# Patient Record
Sex: Female | Born: 1985 | Hispanic: Refuse to answer | Marital: Married | Smoking: Never smoker
Health system: Southern US, Community
[De-identification: ages and names within clinical notes are randomized; demographics above are authoritative.]

## PROBLEM LIST (undated history)

## (undated) DIAGNOSIS — I2699 Other pulmonary embolism without acute cor pulmonale: Secondary | ICD-10-CM

## (undated) DIAGNOSIS — R21 Rash and other nonspecific skin eruption: Secondary | ICD-10-CM

## (undated) DIAGNOSIS — D72819 Decreased white blood cell count, unspecified: Principal | ICD-10-CM

## (undated) DIAGNOSIS — E282 Polycystic ovarian syndrome: Secondary | ICD-10-CM

## (undated) DIAGNOSIS — G939 Disorder of brain, unspecified: Secondary | ICD-10-CM

## (undated) DIAGNOSIS — T148XXA Other injury of unspecified body region, initial encounter: Secondary | ICD-10-CM

## (undated) HISTORY — DX: Disorder of brain, unspecified: G93.9

## (undated) HISTORY — DX: Rash and other nonspecific skin eruption: R21

## (undated) HISTORY — DX: Polycystic ovarian syndrome: E28.2

## (undated) HISTORY — DX: Decreased white blood cell count, unspecified: D72.819

## (undated) HISTORY — DX: Other injury of unspecified body region, initial encounter: T14.8XXA

---

## 2009-03-18 DIAGNOSIS — G939 Disorder of brain, unspecified: Secondary | ICD-10-CM

## 2009-03-18 HISTORY — DX: Disorder of brain, unspecified: G93.9

## 2013-01-01 ENCOUNTER — Telehealth: Payer: Self-pay | Admitting: Hematology and Oncology

## 2013-01-01 NOTE — Telephone Encounter (Signed)
C/D 01/01/13 for appt. 10/29

## 2013-01-01 NOTE — Telephone Encounter (Signed)
S/W PT AND GVE NP APPT 10/29 @ 9:30 W/DR. GORSUCH RERFERRING - CAROL CURTIS, NP DX-PERSISTENT LOW WBC W/COMPLAINTS OF FATIGUE WELCOME PACKET EMAIL.

## 2013-01-13 ENCOUNTER — Other Ambulatory Visit (HOSPITAL_COMMUNITY)
Admission: RE | Admit: 2013-01-13 | Discharge: 2013-01-13 | Disposition: A | Discharge status: Home / Self Care | Payer: 59 | Source: Ambulatory Visit | Attending: Hematology and Oncology | Admitting: Hematology and Oncology

## 2013-01-13 ENCOUNTER — Encounter: Payer: Self-pay | Admitting: Hematology and Oncology

## 2013-01-13 ENCOUNTER — Ambulatory Visit (HOSPITAL_BASED_OUTPATIENT_CLINIC_OR_DEPARTMENT_OTHER): Payer: 59 | Admitting: Hematology and Oncology

## 2013-01-13 ENCOUNTER — Telehealth: Payer: Self-pay | Admitting: Hematology and Oncology

## 2013-01-13 ENCOUNTER — Ambulatory Visit (HOSPITAL_BASED_OUTPATIENT_CLINIC_OR_DEPARTMENT_OTHER): Payer: 59 | Admitting: Lab

## 2013-01-13 ENCOUNTER — Encounter (INDEPENDENT_AMBULATORY_CARE_PROVIDER_SITE_OTHER): Payer: Self-pay

## 2013-01-13 ENCOUNTER — Ambulatory Visit (HOSPITAL_BASED_OUTPATIENT_CLINIC_OR_DEPARTMENT_OTHER): Payer: 59

## 2013-01-13 VITALS — BP 106/64 | HR 58 | Temp 97.5°F | Resp 20 | Ht 69.5 in | Wt 175.1 lb

## 2013-01-13 DIAGNOSIS — D509 Iron deficiency anemia, unspecified: Secondary | ICD-10-CM | POA: Insufficient documentation

## 2013-01-13 DIAGNOSIS — D72819 Decreased white blood cell count, unspecified: Secondary | ICD-10-CM

## 2013-01-13 DIAGNOSIS — R5382 Chronic fatigue, unspecified: Secondary | ICD-10-CM

## 2013-01-13 DIAGNOSIS — R21 Rash and other nonspecific skin eruption: Secondary | ICD-10-CM

## 2013-01-13 HISTORY — DX: Decreased white blood cell count, unspecified: D72.819

## 2013-01-13 HISTORY — DX: Rash and other nonspecific skin eruption: R21

## 2013-01-13 LAB — CBC WITH DIFFERENTIAL/PLATELET
BASO%: 2.2 % — ABNORMAL HIGH (ref 0.0–2.0)
Basophils Absolute: 0.1 10e3/uL (ref 0.0–0.1)
EOS%: 2.5 % (ref 0.0–7.0)
Eosinophils Absolute: 0.1 10e3/uL (ref 0.0–0.5)
HCT: 36.3 % (ref 34.8–46.6)
HGB: 12.3 g/dL (ref 11.6–15.9)
LYMPH%: 45.2 % (ref 14.0–49.7)
MCH: 28.8 pg (ref 25.1–34.0)
MCHC: 33.9 g/dL (ref 31.5–36.0)
MCV: 85 fL (ref 79.5–101.0)
MONO#: 0.4 10e3/uL (ref 0.1–0.9)
MONO%: 12.9 % (ref 0.0–14.0)
NEUT#: 1 10e3/uL — ABNORMAL LOW (ref 1.5–6.5)
NEUT%: 37.2 % — ABNORMAL LOW (ref 38.4–76.8)
Platelets: 182 10e3/uL (ref 145–400)
RBC: 4.27 10e6/uL (ref 3.70–5.45)
RDW: 12.1 % (ref 11.2–14.5)
WBC: 2.8 10e3/uL — ABNORMAL LOW (ref 3.9–10.3)
lymph#: 1.3 10e3/uL (ref 0.9–3.3)

## 2013-01-13 LAB — COMPREHENSIVE METABOLIC PANEL (CC13)
Albumin: 4 g/dL (ref 3.5–5.0)
Anion Gap: 7 mEq/L (ref 3–11)
BUN: 6.9 mg/dL — ABNORMAL LOW (ref 7.0–26.0)
CO2: 23 mEq/L (ref 22–29)
Calcium: 9.8 mg/dL (ref 8.4–10.4)
Chloride: 109 mEq/L (ref 98–109)
Glucose: 87 mg/dl (ref 70–140)
Potassium: 3.9 mEq/L (ref 3.5–5.1)

## 2013-01-13 LAB — FERRITIN CHCC: Ferritin: 35 ng/ml (ref 9–269)

## 2013-01-13 LAB — MORPHOLOGY

## 2013-01-13 LAB — LACTATE DEHYDROGENASE (CC13): LDH: 151 U/L (ref 125–245)

## 2013-01-13 NOTE — Progress Notes (Signed)
[name], [name], [name] 27 y.o. female is here because of abnormal CBC. Over the past few months, she has occasional swelling of lymph nodes under her armpit and groin that comes and goes. The lymph nodes are tender when they are swollen. Most recently, she also noticed some mild rash on her skin around the ankles that are non-itching. She denies rash elsewhere. Most importantly, she had persistent, chronic fatigue. She also has achiness in her muscles and bones from time to time. The patient had history of heavy menstruation and abdominal cramps. She has never been diagnosed with anemia. She also has chronic headaches that comes and goes. She saw a neurologist 3 years ago who order a scan of her head and show some abnormal spots but she was lost to followup. Denies any focal neurological deficit. She denies any recent infection. No recent antibiotic therapy. No history of atypical infection such as shingles or meningitis. The patient has no risk factors for HIV infection. She's never been transfused before and never used intravenous drugs. MEDICAL HISTORY:  Past Medical History  Diagnosis Date  . Lesion of brain 2011  . PCOS (polycystic ovarian syndrome)   . Leukopenia 01/13/2013  . Rash, skin 01/13/2013    SURGICAL HISTORY: History reviewed. No pertinent past surgical history.  SOCIAL HISTORY: History   Social History  . Marital Status: Single    Spouse Name: N/A    Number of Children: N/A  . Years of Education: N/A   Occupational History  . Not on file.   Social History Main Topics  . Smoking status: Never Smoker   . Smokeless tobacco: Not on file  . Alcohol Use: Not on file  . Drug Use: No  . Sexual Activity: Not on file   Other Topics Concern  . Not on file    Social History Narrative  . No narrative on file    FAMILY HISTORY: Family History  Problem Relation Age of Onset  . Graves' disease Mother     ALLERGIES:  has No Known Allergies.  MEDICATIONS:  Current Outpatient Prescriptions  Medication Sig Dispense Refill  . acetaminophen (TYLENOL) 325 MG tablet Take 650 mg by mouth every 6 (six) hours as needed for pain.      Marland Kitchen BIOTIN PO Take by mouth daily.      . Loratadine 10 MG CAPS Take by mouth daily as needed.      . Multiple Vitamins-Minerals (MULTIVITAMIN GUMMIES ADULTS PO) Take by mouth daily.       No current facility-administered medications for this visit.    REVIEW OF SYSTEMS:   Constitutional: Denies fevers, chills or abnormal night sweats Eyes: Denies blurriness of vision, double vision or watery eyes Ears, nose, mouth, throat, and face: Denies mucositis or sore throat Respiratory: Denies cough, dyspnea or wheezes Cardiovascular: Denies palpitation, chest discomfort or lower extremity swelling Gastrointestinal:  Denies nausea, heartburn or change in bowel habits Lymphatics: Denies new lymphadenopathy or easy bruising Neurological:Denies numbness, tingling or new weaknesses Behavioral/Psych: Mood is stable, no new changes  All other systems were reviewed with the patient and are negative.  PHYSICAL EXAMINATION: ECOG PERFORMANCE STATUS: 0 - Asymptomatic  Filed Vitals:   01/13/13 0950  BP: 106/64  Pulse: 58  Temp: 97.5 F (36.4 C)  Resp: 20   Filed Weights  01/13/13 0950  Weight: 175 lb 1.6 oz (79.425 kg)    GENERAL:alert, no distress and comfortable SKIN: Noted small macular/papular rash around her ankles  EYES: normal, conjunctiva are pink and non-injected, sclera clear OROPHARYNX:no exudate, no erythema and lips, buccal mucosa, and tongue normal  NECK: supple, thyroid normal size, non-tender, without nodularity LYMPH:  no palpable lymphadenopathy in the cervical, axillary or inguinal LUNGS: clear to  auscultation and percussion with normal breathing effort HEART: regular rate & rhythm and no murmurs and no lower extremity edema ABDOMEN:abdomen soft, non-tender and normal bowel sounds. No splenomegaly Musculoskeletal:no cyanosis of digits and no clubbing  PSYCH: alert & oriented x 3 with fluent speech NEURO: no focal motor/sensory deficits  LABORATORY DATA:  I have reviewed the data as listed. On 11/26/2012, thyroid function test is normal. CBC showed a white count of 3, hemoglobin 13.6, platelet count 246. On 12/28/2012, white count 3.8, hemoglobin 12.8, and platelet count of 253.  ASSESSMENT:  #1 chronic leukopenia #2 chronic fatigue #3 mild rash around the ankles, unknown etiology #4 history of lymphadenopathy  PLAN:  I recommend bloodwork evaluation to determine the cause of her leukopenia. She has family history of systemic lupus and given her history of skin rash and fatigue and leukopenia, certainly could be one of the differential diagnosis. I will order bloodwork to rule out autoimmune disease as well as rule out infection. Not uncommonly, African American population and a high incidence of congenital leukopenia. The patient is asymptomatic. She is currently not neutropenic. There is no indication to prescribe antibiotics. She has lymphadenopathy of unknown etiology but there were no palpable lymph nodes today. I do not know what caused the rash around her ankles. If her blood work is not revealing, she might need a dermatologist evaluation for possible skin biopsy to rule out vasculitis in the future. I will see her back in 2 weeks to review test results. Orders Placed This Encounter  Procedures  . CBC with Differential    Standing Status: Future     Number of Occurrences:      Standing Expiration Date: 10/05/2013  . Comprehensive metabolic panel    Standing Status: Future     Number of Occurrences:      Standing Expiration Date: 01/13/2014  . Lactate dehydrogenase     Standing Status: Future     Number of Occurrences:      Standing Expiration Date: 01/13/2014  . Morphology    Standing Status: Future     Number of Occurrences:      Standing Expiration Date: 01/13/2014  . Flow Cytometry    R/o leukemia, chronic leukopenia    Standing Status: Future     Number of Occurrences:      Standing Expiration Date: 01/13/2014  . Sedimentation rate    Standing Status: Future     Number of Occurrences:      Standing Expiration Date: 01/13/2014  . Smear    Standing Status: Future     Number of Occurrences:      Standing Expiration Date: 01/13/2014  . Vitamin B12    Standing Status: Future     Number of Occurrences:      Standing Expiration Date: 01/13/2014  . Folate RBC    Standing Status: Future     Number of Occurrences:      Standing Expiration Date: 01/13/2014  . ANA w/Reflex if Positive    Standing Status: Future     Number of Occurrences:  Standing Expiration Date: 01/13/2014  . Rheumatoid factor    Standing Status: Future     Number of Occurrences:      Standing Expiration Date: 01/13/2014  . HIV antibody    Standing Status: Future     Number of Occurrences:      Standing Expiration Date: 01/13/2014  . Hepatitis B surface antibody    Standing Status: Future     Number of Occurrences:      Standing Expiration Date: 01/13/2014  . Hepatitis B core antibody, IgM    Standing Status: Future     Number of Occurrences:      Standing Expiration Date: 01/13/2014  . Hepatitis C antibody    Standing Status: Future     Number of Occurrences:      Standing Expiration Date: 01/13/2014  . C-reactive protein    Standing Status: Future     Number of Occurrences:      Standing Expiration Date: 01/13/2014  . Ferritin    Standing Status: Future     Number of Occurrences:      Standing Expiration Date: 01/13/2014    All questions were answered. The patient knows to call the clinic with any problems, questions or concerns.    Phoebie Shad,  MD 01/13/2013 10:28 AM

## 2013-01-13 NOTE — Progress Notes (Signed)
Checked in new patient with no financial issues. She has not been out of the country and has appt card.

## 2013-01-13 NOTE — Telephone Encounter (Signed)
gv and printed appt sched and avs for pt for NOV...sent pt to lab °

## 2013-01-14 LAB — SEDIMENTATION RATE: Sed Rate: 4 mm/hr (ref 0–22)

## 2013-01-14 LAB — HEPATITIS B SURFACE ANTIBODY,QUALITATIVE: Hep B S Ab: POSITIVE — AB

## 2013-01-14 LAB — RHEUMATOID FACTOR: Rhuematoid fact SerPl-aCnc: <10 IU/mL (ref <=14)

## 2013-01-14 LAB — HEPATITIS B CORE ANTIBODY, IGM: Hep B C IgM: NONREACTIVE

## 2013-01-14 LAB — FOLATE RBC: RBC Folate: 715 ng/mL (ref >=366)

## 2013-01-14 LAB — HIV ANTIBODY (ROUTINE TESTING W REFLEX): HIV: NONREACTIVE

## 2013-01-27 ENCOUNTER — Telehealth: Payer: Self-pay | Admitting: Hematology and Oncology

## 2013-01-27 ENCOUNTER — Ambulatory Visit (HOSPITAL_BASED_OUTPATIENT_CLINIC_OR_DEPARTMENT_OTHER): Payer: 59 | Admitting: Hematology and Oncology

## 2013-01-27 ENCOUNTER — Ambulatory Visit (HOSPITAL_BASED_OUTPATIENT_CLINIC_OR_DEPARTMENT_OTHER): Payer: 59

## 2013-01-27 VITALS — BP 121/69 | HR 93 | Temp 97.8°F | Resp 18 | Ht 69.5 in | Wt 177.3 lb

## 2013-01-27 DIAGNOSIS — R5381 Other malaise: Secondary | ICD-10-CM

## 2013-01-27 DIAGNOSIS — R5383 Other fatigue: Secondary | ICD-10-CM | POA: Insufficient documentation

## 2013-01-27 DIAGNOSIS — D72819 Decreased white blood cell count, unspecified: Secondary | ICD-10-CM

## 2013-01-27 DIAGNOSIS — R21 Rash and other nonspecific skin eruption: Secondary | ICD-10-CM

## 2013-01-27 DIAGNOSIS — D509 Iron deficiency anemia, unspecified: Secondary | ICD-10-CM

## 2013-01-27 LAB — TSH CHCC: TSH: 1.049 m(IU)/L (ref 0.308–3.960)

## 2013-01-27 LAB — CBC WITH DIFFERENTIAL/PLATELET
Basophils Absolute: 0.1 10*3/uL (ref 0.0–0.1)
EOS%: 2.3 % (ref 0.0–7.0)
Eosinophils Absolute: 0.1 10*3/uL (ref 0.0–0.5)
LYMPH%: 29.3 % (ref 14.0–49.7)
MCH: 29 pg (ref 25.1–34.0)
MCV: 88.1 fL (ref 79.5–101.0)
MONO%: 10.2 % (ref 0.0–14.0)
NEUT#: 2.9 10*3/uL (ref 1.5–6.5)
Platelets: 221 10*3/uL (ref 145–400)
RBC: 4.31 10*6/uL (ref 3.70–5.45)
RDW: 12.9 % (ref 11.2–14.5)
lymph#: 1.5 10*3/uL (ref 0.9–3.3)

## 2013-01-27 NOTE — Progress Notes (Signed)
[facility] OFFICE PROGRESS NOTE  MCNEILL,WENDY, MD DIAGNOSIS:  Leukopenia  SUMMARY OF HEMATOLOGIC HISTORY: This patient was referred here because of leukopenia. She also has occasional swelling underneath her armpit that comes and goes. The patient also had fatigue. INTERVAL HISTORY: Pamela Oconnor 27 y.o. female returns for followup. She is still not feeling well. She denies any recent fever, chills, night sweats or abnormal weight loss The skin rash around her ankles are persistent I have reviewed the past medical history, past surgical history, social history and family history with the patient and they are unchanged from previous note.  ALLERGIES:  has No Known Allergies.  MEDICATIONS:  Current Outpatient Prescriptions  Medication Sig Dispense Refill  . acetaminophen (TYLENOL) 325 MG tablet Take 650 mg by mouth every 6 (six) hours as needed for pain.      Marland Kitchen BIOTIN PO Take by mouth daily.      . Loratadine 10 MG CAPS Take by mouth daily as needed.      . Multiple Vitamins-Minerals (MULTIVITAMIN GUMMIES ADULTS PO) Take by mouth daily.       No current facility-administered medications for this visit.     REVIEW OF SYSTEMS:   Constitutional: Denies fevers, chills or night sweats Eyes: Denies blurriness of vision Ears, nose, mouth, throat, and face: Denies mucositis or sore throat Respiratory: Denies cough, dyspnea or wheezes Cardiovascular: Denies palpitation, chest discomfort or lower extremity swelling Gastrointestinal:  Denies nausea, heartburn or change in bowel habits Skin: Denies abnormal skin rashes Lymphatics: Denies new lymphadenopathy or easy bruising Neurological:Denies numbness, tingling or new weaknesses Behavioral/Psych: Mood is stable, no new changes  All other systems were reviewed with the patient and are negative.  PHYSICAL EXAMINATION: ECOG PERFORMANCE STATUS: 1 - Symptomatic but completely ambulatory  Filed Vitals:   01/27/13 1218  BP:  121/69  Pulse: 93  Temp: 97.8 F (36.6 C)  Resp: 18   Filed Weights   01/27/13 1218  Weight: 177 lb 4.8 oz (80.423 kg)    GENERAL:alert, no distress and comfortable SKIN: skin color, texture, turgor are normal, with persistent macular rash around her ankles NEURO: alert & oriented x 3 with fluent speech, no focal motor/sensory deficits  LABORATORY DATA:  I have reviewed the data as listed I also reviewed her peripheral smear. There was absolute leukopenia on the peripheral dated 01/13/2013. The morphology of the white blood cells and red blood cells were normal  ASSESSMENT:  #1 leukopenia #2 mild iron deficiency #3 abnormal rash #4 fatigue  PLAN:  #1 leukopenia I rechecked his CBC again today. The results came back within normal limits. It is likely she may have a transient viral infection but it has resolved. No further workup is needed. At present time, there is no indication that she have lymphoma or leukemia. I would not recommend further testing #2 mild iron deficiency Even though she is not anemic, her iron is borderline low. I recommend she continue on multivitamin #3 abnormal rash I recommend referral to dermatologist.  #4 fatigue I am checking her thyroid function tests today. Her mother had a history of Graves' disease. I will call the patient back with test results  All questions were answered. The patient knows to call the clinic with any problems, questions or concerns. No barriers to learning was detected.    [facility], Kenedee Molesky, MD 01/27/2013 1:38 PM

## 2013-01-27 NOTE — Telephone Encounter (Signed)
gv and printed appt sched and avs for pt for NOV...gv pt barium  °

## 2013-01-27 NOTE — Telephone Encounter (Signed)
gv and printed appt sched and avs forpt for Jan 6 @ 8:20 with Dr. Sharyn Lull

## 2013-01-28 LAB — HCG, SERUM, QUALITATIVE: Preg, Serum: NEGATIVE

## 2013-01-28 LAB — T4, FREE: Free T4: 1.09 ng/dL (ref 0.80–1.80)

## 2013-02-03 ENCOUNTER — Ambulatory Visit: Payer: 59 | Admitting: Hematology and Oncology

## 2013-05-17 ENCOUNTER — Encounter (HOSPITAL_BASED_OUTPATIENT_CLINIC_OR_DEPARTMENT_OTHER): Payer: Self-pay | Admitting: Emergency Medicine

## 2013-05-17 DIAGNOSIS — Z8669 Personal history of other diseases of the nervous system and sense organs: Secondary | ICD-10-CM | POA: Insufficient documentation

## 2013-05-17 DIAGNOSIS — Z3202 Encounter for pregnancy test, result negative: Secondary | ICD-10-CM | POA: Insufficient documentation

## 2013-05-17 DIAGNOSIS — Z8639 Personal history of other endocrine, nutritional and metabolic disease: Secondary | ICD-10-CM | POA: Insufficient documentation

## 2013-05-17 DIAGNOSIS — M25519 Pain in unspecified shoulder: Secondary | ICD-10-CM | POA: Insufficient documentation

## 2013-05-17 DIAGNOSIS — R0789 Other chest pain: Secondary | ICD-10-CM | POA: Insufficient documentation

## 2013-05-17 DIAGNOSIS — Z862 Personal history of diseases of the blood and blood-forming organs and certain disorders involving the immune mechanism: Secondary | ICD-10-CM | POA: Insufficient documentation

## 2013-05-17 NOTE — ED Notes (Signed)
Chest and back pain

## 2013-05-18 ENCOUNTER — Emergency Department (HOSPITAL_BASED_OUTPATIENT_CLINIC_OR_DEPARTMENT_OTHER): Payer: 59

## 2013-05-18 ENCOUNTER — Emergency Department (HOSPITAL_BASED_OUTPATIENT_CLINIC_OR_DEPARTMENT_OTHER)
Admission: EM | Admit: 2013-05-18 | Discharge: 2013-05-18 | Disposition: A | Discharge status: Home / Self Care | Payer: 59 | Attending: Emergency Medicine | Admitting: Emergency Medicine

## 2013-05-18 DIAGNOSIS — R0789 Other chest pain: Secondary | ICD-10-CM

## 2013-05-18 LAB — CBC WITH DIFFERENTIAL/PLATELET
BASOS PCT: 1 % (ref 0–1)
Basophils Absolute: 0 10*3/uL (ref 0.0–0.1)
EOS ABS: 0.1 10*3/uL (ref 0.0–0.7)
EOS PCT: 2 % (ref 0–5)
HEMATOCRIT: 36.7 % (ref 36.0–46.0)
HEMOGLOBIN: 12.4 g/dL (ref 12.0–15.0)
LYMPHS ABS: 1.8 10*3/uL (ref 0.7–4.0)
Lymphocytes Relative: 46 % (ref 12–46)
MCH: 29.7 pg (ref 26.0–34.0)
MCHC: 33.8 g/dL (ref 30.0–36.0)
MCV: 87.8 fL (ref 78.0–100.0)
MONO ABS: 0.5 10*3/uL (ref 0.1–1.0)
MONOS PCT: 12 % (ref 3–12)
Neutro Abs: 1.5 10*3/uL — ABNORMAL LOW (ref 1.7–7.7)
Neutrophils Relative %: 39 % — ABNORMAL LOW (ref 43–77)
Platelets: 237 10*3/uL (ref 150–400)
RBC: 4.18 MIL/uL (ref 3.87–5.11)
RDW: 11.5 % (ref 11.5–15.5)
WBC: 3.9 10*3/uL — ABNORMAL LOW (ref 4.0–10.5)

## 2013-05-18 LAB — URINALYSIS, ROUTINE W REFLEX MICROSCOPIC
Bilirubin Urine: NEGATIVE
Glucose, UA: NEGATIVE mg/dL
HGB URINE DIPSTICK: NEGATIVE
KETONES UR: NEGATIVE mg/dL
LEUKOCYTES UA: NEGATIVE
Nitrite: NEGATIVE
PROTEIN: NEGATIVE mg/dL
Specific Gravity, Urine: 1.023 (ref 1.005–1.030)
Urobilinogen, UA: 0.2 mg/dL (ref 0.0–1.0)
pH: 7.5 (ref 5.0–8.0)

## 2013-05-18 LAB — BASIC METABOLIC PANEL
BUN: 12 mg/dL (ref 6–23)
CALCIUM: 9.2 mg/dL (ref 8.4–10.5)
CO2: 26 mEq/L (ref 19–32)
CREATININE: 0.9 mg/dL (ref 0.50–1.10)
Chloride: 103 mEq/L (ref 96–112)
GFR, EST NON AFRICAN AMERICAN: 87 mL/min — AB (ref >90)
GLUCOSE: 104 mg/dL — AB (ref 70–99)
Potassium: 4.2 mEq/L (ref 3.7–5.3)
Sodium: 141 mEq/L (ref 137–147)

## 2013-05-18 LAB — TROPONIN I: Troponin I: <0.3 ng/mL (ref <0.30)

## 2013-05-18 LAB — D-DIMER, QUANTITATIVE: D-Dimer, Quant: <0.27 ug/mL-FEU (ref 0.00–0.48)

## 2013-05-18 LAB — PREGNANCY, URINE: Preg Test, Ur: NEGATIVE

## 2013-05-18 NOTE — ED Provider Notes (Signed)
CSN: [id number]     Arrival date & time 05/17/13  2337 History  This chart was scribed for Pamela Dohmen B. Bernette Mayers, MD by Beverly Milch, ED Scribe. This patient was seen in room MH09/MH09 and the patient's care was started at 12:27 AM.    Chief Complaint  Patient presents with  . Chest Pain      The history is provided by the patient. No language interpreter was used.   HPI Comments: Pamela Oconnor is a 28 y.o. female who presents to the Emergency Department complaining of sharp sudden aching chest discomfort that began around 4 PM under her left breast and in the center of her chest with associated pain behind her left shoulder. She took a nap around 8 PM and was woken at 9 PM with more pain. She states she is concerned about her stress level being connected to her discomfort. She states her LNMP was a month ago today. Pt reports dyspareunia 2 days ago. Pt denies leg swelling and difficulty having BM. Pt is on birth control.   Past Medical History  Diagnosis Date  . Lesion of brain 2011  . PCOS (polycystic ovarian syndrome)   . Leukopenia 01/13/2013  . Rash, skin 01/13/2013    History reviewed. No pertinent past surgical history. Family History  Problem Relation Age of Onset  . Graves' disease Mother     History  Substance Use Topics  . Smoking status: Never Smoker   . Smokeless tobacco: Not on file  . Alcohol Use: No    OB History   Grav Para Term Preterm Abortions TAB SAB Ect Mult Living                  Review of Systems A complete 10 system review of systems was obtained and all systems are negative except as noted in the HPI and PMH.     Allergies  Review of patient's allergies indicates no known allergies.  Home Medications   Current Outpatient Rx  Name  Route  Sig  Dispense  Refill  . acetaminophen (TYLENOL) 325 MG tablet   Oral   Take 650 mg by mouth every 6 (six) hours as needed for pain.         . Loratadine 10 MG CAPS   Oral   Take by mouth  daily as needed.         . Multiple Vitamins-Minerals (MULTIVITAMIN GUMMIES ADULTS PO)   Oral   Take by mouth daily.          Triage Vitals: BP 123/76  Pulse 70  Temp(Src) 98.3 F (36.8 C) (Oral)  Resp 18  Ht 5\' 11"  (1.803 m)  Wt 180 lb (81.647 kg)  BMI 25.12 kg/m2  SpO2 98%  Physical Exam  Nursing note and vitals reviewed. Constitutional: She is oriented to person, place, and time. She appears well-developed and well-nourished.  HENT:  Head: Normocephalic and atraumatic.  Eyes: EOM are normal. Pupils are equal, round, and reactive to light.  Neck: Normal range of motion. Neck supple.  Cardiovascular: Normal rate, normal heart sounds and intact distal pulses.   Pulmonary/Chest: Effort normal and breath sounds normal.  Abdominal: Bowel sounds are normal. She exhibits no distension. There is no tenderness.  Musculoskeletal: Normal range of motion. She exhibits no edema and no tenderness.  Neurological: She is alert and oriented to person, place, and time. She has normal strength. No cranial nerve deficit or sensory deficit.  Skin: Skin is warm and dry.  No rash noted.  Psychiatric: She has a normal mood and affect.    ED Course  Procedures (including critical care time)  DIAGNOSTIC STUDIES: Oxygen Saturation is 98% on RA, normal by my interpretation.    COORDINATION OF CARE: 12:34 AM- Pt advised of plan for treatment and pt agrees.    Labs Review Labs Reviewed  CBC WITH DIFFERENTIAL - Abnormal; Notable for the following:    WBC 3.9 (*)    Neutrophils Relative % 39 (*)    Neutro Abs 1.5 (*)    All other components within normal limits  BASIC METABOLIC PANEL - Abnormal; Notable for the following:    Glucose, Bld 104 (*)    GFR calc non Af Amer 87 (*)    All other components within normal limits  TROPONIN I  D-DIMER, QUANTITATIVE  URINALYSIS, ROUTINE W REFLEX MICROSCOPIC  PREGNANCY, URINE   Imaging Review Dg Chest 2 View  05/18/2013   CLINICAL DATA:  Chest  pain and shortness of breath  EXAM: CHEST  2 VIEW  COMPARISON:  None.  FINDINGS: Normal heart size and mediastinal contours. No acute infiltrate or edema. No effusion or pneumothorax. No acute osseous findings.  IMPRESSION: No active cardiopulmonary disease.   Electronically Signed   By: Tiburcio PeaJonathan  Watts M.D.   On: 05/18/2013 01:40     EKG Interpretation   Date/Time:  Monday May 17 2013 23:53:06 EST Ventricular Rate:  81 PR Interval:  128 QRS Duration: 72 QT Interval:  388 QTC Calculation: 450 R Axis:   71 Text Interpretation:  Normal sinus rhythm with sinus arrhythmia Normal ECG  No old tracing to compare Confirmed by [facility]  MD, Leonette MostHARLES [phone]([id number]) on  05/18/2013 1:14:44 AM      MDM   Final diagnoses:  Atypical chest pain    Labs and imaging unremarkable. Pt is a TIMI 0 with atypical symptoms, unlikely to be ACS. Low risk overall for PE, with neg Dimer doubt this is the cause of her pain either. Pt to be discharged with NSAIDs as needed for pain, return for any worsening or worrisome symptoms.   I personally performed the services described in this documentation, which was scribed in my presence. The recorded information has been reviewed and is accurate.     Kishawn Pickar B. Bernette MayersSheldon, MD 05/18/13 [id number]

## 2013-05-18 NOTE — Discharge Instructions (Signed)
Chest Pain (Nonspecific) °It is often hard to give a specific diagnosis for the cause of chest pain. There is always a chance that your pain could be related to something serious, such as a heart attack or a blood clot in the lungs. You need to follow up with your caregiver for further evaluation. °CAUSES  °· Heartburn. °· Pneumonia or bronchitis. °· Anxiety or stress. °· Inflammation around your heart (pericarditis) or lung (pleuritis or pleurisy). °· A blood clot in the lung. °· A collapsed lung (pneumothorax). It can develop suddenly on its own (spontaneous pneumothorax) or from injury (trauma) to the chest. °· Shingles infection (herpes zoster virus). °The chest wall is composed of bones, muscles, and cartilage. Any of these can be the source of the pain. °· The bones can be bruised by injury. °· The muscles or cartilage can be strained by coughing or overwork. °· The cartilage can be affected by inflammation and become sore (costochondritis). °DIAGNOSIS  °Lab tests or other studies, such as X-rays, electrocardiography, stress testing, or cardiac imaging, may be needed to find the cause of your pain.  °TREATMENT  °· Treatment depends on what may be causing your chest pain. Treatment may include: °· Acid blockers for heartburn. °· Anti-inflammatory medicine. °· Pain medicine for inflammatory conditions. °· Antibiotics if an infection is present. °· You may be advised to change lifestyle habits. This includes stopping smoking and avoiding alcohol, caffeine, and chocolate. °· You may be advised to keep your head raised (elevated) when sleeping. This reduces the chance of acid going backward from your stomach into your esophagus. °· Most of the time, nonspecific chest pain will improve within 2 to 3 days with rest and mild pain medicine. °HOME CARE INSTRUCTIONS  °· If antibiotics were prescribed, take your antibiotics as directed. Finish them even if you start to feel better. °· For the next few days, avoid physical  activities that bring on chest pain. Continue physical activities as directed. °· Do not smoke. °· Avoid drinking alcohol. °· Only take over-the-counter or prescription medicine for pain, discomfort, or fever as directed by your caregiver. °· Follow your caregiver's suggestions for further testing if your chest pain does not go away. °· Keep any follow-up appointments you made. If you do not go to an appointment, you could develop lasting (chronic) problems with pain. If there is any problem keeping an appointment, you must call to reschedule. °SEEK MEDICAL CARE IF:  °· You think you are having problems from the medicine you are taking. Read your medicine instructions carefully. °· Your chest pain does not go away, even after treatment. °· You develop a rash with blisters on your chest. °SEEK IMMEDIATE MEDICAL CARE IF:  °· You have increased chest pain or pain that spreads to your arm, neck, jaw, back, or abdomen. °· You develop shortness of breath, an increasing cough, or you are coughing up blood. °· You have severe back or abdominal pain, feel nauseous, or vomit. °· You develop severe weakness, fainting, or chills. °· You have a fever. °THIS IS AN EMERGENCY. Do not wait to see if the pain will go away. Get medical help at once. Call your local emergency services (911 in [place] Do not drive yourself to the hospital. °MAKE SURE YOU:  °· Understand these instructions. °· Will watch your condition. °· Will get help right away if you are not doing well or get worse. °Document Released: 12/12/2004 Document Revised: 05/27/2011 Document Reviewed: 10/08/2007 °[employer]® Patient Information ©2014 [employer]   [employer]. ° °

## 2013-05-18 NOTE — ED Notes (Signed)
Pt. Reports she drove herself here tonight.

## 2015-03-30 ENCOUNTER — Ambulatory Visit (INDEPENDENT_AMBULATORY_CARE_PROVIDER_SITE_OTHER): Payer: 59 | Admitting: Neurology

## 2015-03-30 ENCOUNTER — Encounter: Payer: Self-pay | Admitting: Neurology

## 2015-03-30 VITALS — BP 108/73 | HR 75 | Ht 71.0 in | Wt 197.5 lb

## 2015-03-30 DIAGNOSIS — R202 Paresthesia of skin: Secondary | ICD-10-CM

## 2015-03-30 NOTE — Progress Notes (Signed)
Reason for visit: Paresthesias  Referring physician: Dr. Marcelline Mates is a 30 y.o. female  History of present illness:  Pamela Oconnor is a 30 year old black female with a history of onset of sensory complaints involving the lower extremities that began on 03/17/2015. The patient indicated that she began having achy sensations in her legs, primarily on the left with onset of intermittent burning sensations in both legs up to the hips. This is somewhat worse on the right side. The patient will have onset of symptoms that may come on and last several hours, and seems to improve with Aleve. The patient will feel normal at times. She denies any sensory alteration on the body, and no sensory alteration on the arms. She denies any back pain or neck pain. She denies issues controlling the bowels or the bladder. She indicates that in 2011 she had right-sided facial numbness and she underwent a workup that included MRI of the brain. She was told she had a small spot in the left brain. She has had chronic issues with neutropenia, this was worked up through hematology in the past. The patient indicates that she recently had blood work to include a Automotive engineer, liver panel, and CBC. The white count remains somewhat low. She comes to this office for further evaluation. She denies any significant fatigue issues, fevers or chills, or weight loss.  Past Medical History  Diagnosis Date  . Lesion of brain 2011  . PCOS (polycystic ovarian syndrome)   . Leukopenia 01/13/2013  . Rash, skin 01/13/2013  . Fracture     bilateral heels    History reviewed. No pertinent past surgical history.  Family History  Problem Relation Age of Onset  . Graves' disease Mother   . Hypertension Mother   . Hypertension Father   . Alzheimer's disease Maternal Grandmother   . Kidney failure Paternal Grandmother   . Lupus Other     Social history:  reports that she has never smoked. She has never used smokeless  tobacco. She reports that she drinks alcohol. She reports that she does not use illicit drugs.  Medications:  Prior to Admission medications   Medication Sig Start Date End Date Taking? Authorizing Provider  Naproxen Sodium (ALEVE) 220 MG CAPS Take by mouth.   Yes Historical Provider, Oconnor  TRINESSA LO 0.18/0.215/0.25 MG-25 MCG tab Take 1 tablet by mouth daily. Reported on 03/30/2015 03/14/15   Historical Provider, Oconnor     No Known Allergies  ROS:  Out of a complete 14 system review of symptoms, the patient complains only of the following symptoms, and all other reviewed systems are negative.  Fatigue Headache Restless legs  Blood pressure 108/73, pulse 75, height 5\' 11"  (1.803 m), weight 197 lb 8 oz (89.585 kg).  Physical Exam  General: The patient is alert and cooperative at the time of the examination.  Eyes: Pupils are equal, round, and reactive to light. Discs are flat bilaterally.  Neck: The neck is supple, no carotid bruits are noted.  Respiratory: The respiratory examination is clear.  Cardiovascular: The cardiovascular examination reveals a regular rate and rhythm, no obvious murmurs or rubs are noted.  Skin: Extremities are without significant edema.  Neurologic Exam  Mental status: The patient is alert and oriented x 3 at the time of the examination. The patient has apparent normal recent and remote memory, with an apparently normal attention span and concentration ability.  Cranial nerves: Facial symmetry is present. There is good sensation  of the face to pinprick and soft touch bilaterally. The strength of the facial muscles and the muscles to head turning and shoulder shrug are normal bilaterally. Speech is well enunciated, no aphasia or dysarthria is noted. Extraocular movements are full. Visual fields are full. The tongue is midline, and the patient has symmetric elevation of the soft palate. No obvious hearing deficits are noted.  Motor: The motor testing reveals  5 over 5 strength of all 4 extremities. Good symmetric motor tone is noted throughout.  Sensory: Sensory testing is intact to pinprick, soft touch, vibration sensation, and position sense on all 4 extremities. No evidence of extinction is noted.  Coordination: Cerebellar testing reveals good finger-nose-finger and heel-to-shin bilaterally.  Gait and station: Gait is normal. Tandem gait is normal. Romberg is negative. No drift is seen.  Reflexes: Deep tendon reflexes are symmetric and normal bilaterally. Toes are downgoing bilaterally.   Assessment/Plan:  1. Paresthesias, lower extremities  The patient has a normal clinical examination today. She reports transient achy pain and transient burning sensations in the legs. She gives a history of transient right facial numbness in 2011, and a history of an abnormal MRI of the brain at that time. We will pursue further workup to evaluate for possible demyelinating disease. She recently had blood work that included an ANA that was unremarkable. She will be sent for MRI of the brain and thoracic spine with and without gadolinium enhancement. If the studies are normal, she will be followed conservatively.  Marlan Oconnor. Pamela Oconnor 03/30/2015 6:47 PM  [facility] [address] [place], [place] [id number]  Phone [phone] Fax [phone]

## 2015-03-30 NOTE — Patient Instructions (Signed)
Paresthesia Paresthesia is an abnormal burning or prickling sensation. This sensation is generally felt in the hands, arms, legs, or feet. However, it may occur in any part of the body. Usually, it is not painful. The feeling may be described as:  Tingling or numbness.  Pins and needles.  Skin crawling.  Buzzing.  Limbs falling asleep.  Itching. Most people experience temporary (transient) paresthesia at some time in their lives. Paresthesia may occur when you breathe too quickly (hyperventilation). It can also occur without any apparent cause. Commonly, paresthesia occurs when pressure is placed on a nerve. The sensation quickly goes away after the pressure is removed. For some people, however, paresthesia is a long-lasting (chronic) condition that is caused by an underlying disorder. If you continue to have paresthesia, you may need further medical evaluation. HOME CARE INSTRUCTIONS Watch your condition for any changes. Taking the following actions may help to lessen any discomfort that you are feeling:  Avoid drinking alcohol.  Try acupuncture or massage to help relieve your symptoms.  Keep all follow-up visits as directed by your health care provider. This is important. SEEK MEDICAL CARE IF:  You continue to have episodes of paresthesia.  Your burning or prickling feeling gets worse when you walk.  You have pain, cramps, or dizziness.  You develop a rash. SEEK IMMEDIATE MEDICAL CARE IF:  You feel weak.  You have trouble walking or moving.  You have problems with speech, understanding, or vision.  You feel confused.  You cannot control your bladder or bowel movements.  You have numbness after an injury.  You faint.   This information is not intended to replace advice given to you by your health care provider. Make sure you discuss any questions you have with your health care provider.   Document Released: 02/22/2002 Document Revised: 07/19/2014 Document Reviewed:  02/28/2014 Elsevier Interactive Patient Education 2016 [employer]  

## 2015-04-12 ENCOUNTER — Ambulatory Visit (INDEPENDENT_AMBULATORY_CARE_PROVIDER_SITE_OTHER): Payer: 59

## 2015-04-12 DIAGNOSIS — R202 Paresthesia of skin: Secondary | ICD-10-CM | POA: Diagnosis not present

## 2015-04-13 MED ORDER — GADOPENTETATE DIMEGLUMINE 469.01 MG/ML IV SOLN: 18.0000 mL | Freq: Once | INTRAVENOUS | Status: AC | PRN

## 2015-04-14 ENCOUNTER — Telehealth: Payer: Self-pay | Admitting: Neurology

## 2015-04-14 NOTE — Telephone Encounter (Signed)
  I called the patient. The MRI of the thoracic spine is normal. MRI of the brain is essentially normal. A questionable minimal spot in the Pons. The left brain lesion reported by the patient that was found on a previous MRI is not apparent on this study.  MRI thoracic 04/11/14:  IMPRESSION: This is a normal MRI of the thoracic spine with and without contrast.    MRI brain 04/12/15:  IMPRESSION: This MRI of the brain with and without contrast shows a following: 1. There is a subtle T2/FLAIR hyperintense focus noted in the anterior medial pons on axial images that is not appreciated on the coronal images. This could be artifact or represent a small chronic focus of ischemic or demyelinating change. 2. There are no acute findings.

## 2018-11-09 DIAGNOSIS — Z6831 Body mass index (BMI) 31.0-31.9, adult: Secondary | ICD-10-CM | POA: Diagnosis not present

## 2018-11-09 DIAGNOSIS — H6591 Unspecified nonsuppurative otitis media, right ear: Secondary | ICD-10-CM | POA: Diagnosis not present

## 2018-11-11 DIAGNOSIS — F4323 Adjustment disorder with mixed anxiety and depressed mood: Secondary | ICD-10-CM | POA: Diagnosis not present

## 2018-11-25 DIAGNOSIS — F4323 Adjustment disorder with mixed anxiety and depressed mood: Secondary | ICD-10-CM | POA: Diagnosis not present

## 2018-12-02 DIAGNOSIS — F4323 Adjustment disorder with mixed anxiety and depressed mood: Secondary | ICD-10-CM | POA: Diagnosis not present

## 2018-12-09 DIAGNOSIS — F4323 Adjustment disorder with mixed anxiety and depressed mood: Secondary | ICD-10-CM | POA: Diagnosis not present

## 2018-12-22 DIAGNOSIS — D72819 Decreased white blood cell count, unspecified: Secondary | ICD-10-CM | POA: Diagnosis not present

## 2018-12-22 DIAGNOSIS — E7849 Other hyperlipidemia: Secondary | ICD-10-CM | POA: Diagnosis not present

## 2018-12-22 DIAGNOSIS — Z0000 Encounter for general adult medical examination without abnormal findings: Secondary | ICD-10-CM | POA: Diagnosis not present

## 2018-12-30 DIAGNOSIS — F4323 Adjustment disorder with mixed anxiety and depressed mood: Secondary | ICD-10-CM | POA: Diagnosis not present

## 2019-01-06 DIAGNOSIS — F4323 Adjustment disorder with mixed anxiety and depressed mood: Secondary | ICD-10-CM | POA: Diagnosis not present

## 2019-01-13 DIAGNOSIS — F4323 Adjustment disorder with mixed anxiety and depressed mood: Secondary | ICD-10-CM | POA: Diagnosis not present

## 2019-01-19 DIAGNOSIS — F4323 Adjustment disorder with mixed anxiety and depressed mood: Secondary | ICD-10-CM | POA: Diagnosis not present

## 2019-01-27 DIAGNOSIS — F4323 Adjustment disorder with mixed anxiety and depressed mood: Secondary | ICD-10-CM | POA: Diagnosis not present

## 2019-02-03 DIAGNOSIS — F4323 Adjustment disorder with mixed anxiety and depressed mood: Secondary | ICD-10-CM | POA: Diagnosis not present

## 2019-02-07 DIAGNOSIS — Z6830 Body mass index (BMI) 30.0-30.9, adult: Secondary | ICD-10-CM | POA: Diagnosis not present

## 2019-02-07 DIAGNOSIS — R079 Chest pain, unspecified: Secondary | ICD-10-CM | POA: Diagnosis not present

## 2019-02-07 DIAGNOSIS — R0602 Shortness of breath: Secondary | ICD-10-CM | POA: Diagnosis not present

## 2019-02-07 DIAGNOSIS — E282 Polycystic ovarian syndrome: Secondary | ICD-10-CM | POA: Diagnosis not present

## 2019-02-07 DIAGNOSIS — R110 Nausea: Secondary | ICD-10-CM | POA: Diagnosis not present

## 2019-02-07 DIAGNOSIS — Z20828 Contact with and (suspected) exposure to other viral communicable diseases: Secondary | ICD-10-CM | POA: Diagnosis not present

## 2019-02-07 DIAGNOSIS — R61 Generalized hyperhidrosis: Secondary | ICD-10-CM | POA: Diagnosis not present

## 2019-02-07 DIAGNOSIS — R918 Other nonspecific abnormal finding of lung field: Secondary | ICD-10-CM | POA: Diagnosis not present

## 2019-02-07 DIAGNOSIS — I2699 Other pulmonary embolism without acute cor pulmonale: Secondary | ICD-10-CM | POA: Diagnosis not present

## 2019-02-07 DIAGNOSIS — Z6831 Body mass index (BMI) 31.0-31.9, adult: Secondary | ICD-10-CM | POA: Diagnosis not present

## 2019-02-08 DIAGNOSIS — R918 Other nonspecific abnormal finding of lung field: Secondary | ICD-10-CM | POA: Diagnosis not present

## 2019-02-08 DIAGNOSIS — R079 Chest pain, unspecified: Secondary | ICD-10-CM | POA: Diagnosis not present

## 2019-02-10 DIAGNOSIS — I2699 Other pulmonary embolism without acute cor pulmonale: Secondary | ICD-10-CM | POA: Diagnosis not present

## 2019-02-10 DIAGNOSIS — Z6831 Body mass index (BMI) 31.0-31.9, adult: Secondary | ICD-10-CM | POA: Diagnosis not present

## 2019-02-10 DIAGNOSIS — E282 Polycystic ovarian syndrome: Secondary | ICD-10-CM | POA: Diagnosis not present

## 2019-11-02 ENCOUNTER — Emergency Department (HOSPITAL_BASED_OUTPATIENT_CLINIC_OR_DEPARTMENT_OTHER): Payer: BC Managed Care – PPO

## 2019-11-02 ENCOUNTER — Encounter (HOSPITAL_BASED_OUTPATIENT_CLINIC_OR_DEPARTMENT_OTHER): Payer: Self-pay

## 2019-11-02 ENCOUNTER — Other Ambulatory Visit: Payer: Self-pay

## 2019-11-02 DIAGNOSIS — Z86711 Personal history of pulmonary embolism: Secondary | ICD-10-CM | POA: Diagnosis not present

## 2019-11-02 DIAGNOSIS — R0789 Other chest pain: Secondary | ICD-10-CM | POA: Diagnosis not present

## 2019-11-02 DIAGNOSIS — R079 Chest pain, unspecified: Secondary | ICD-10-CM | POA: Diagnosis not present

## 2019-11-02 LAB — BASIC METABOLIC PANEL
Anion gap: 9 (ref 5–15)
BUN: 14 mg/dL (ref 6–20)
CO2: 25 mmol/L (ref 22–32)
Calcium: 9.6 mg/dL (ref 8.9–10.3)
Chloride: 104 mmol/L (ref 98–111)
Creatinine, Ser: 0.6 mg/dL (ref 0.44–1.00)
GFR calc Af Amer: >60 mL/min (ref >60)
GFR calc non Af Amer: >60 mL/min (ref >60)
Glucose, Bld: 93 mg/dL (ref 70–99)
Potassium: 3.7 mmol/L (ref 3.5–5.1)
Sodium: 138 mmol/L (ref 135–145)

## 2019-11-02 LAB — CBC
HCT: 40.4 % (ref 36.0–46.0)
Hemoglobin: 13.1 g/dL (ref 12.0–15.0)
MCH: 28.4 pg (ref 26.0–34.0)
MCHC: 32.4 g/dL (ref 30.0–36.0)
MCV: 87.4 fL (ref 80.0–100.0)
Platelets: 288 10*3/uL (ref 150–400)
RBC: 4.62 MIL/uL (ref 3.87–5.11)
RDW: 12 % (ref 11.5–15.5)
WBC: 5.1 10*3/uL (ref 4.0–10.5)
nRBC: 0 % (ref 0.0–0.2)

## 2019-11-02 LAB — TROPONIN I (HIGH SENSITIVITY): Troponin I (High Sensitivity): 2 ng/L (ref <18)

## 2019-11-02 LAB — PREGNANCY, URINE: Preg Test, Ur: NEGATIVE

## 2019-11-02 MED ORDER — IOHEXOL 350 MG/ML SOLN
100.0000 mL | Freq: Once | INTRAVENOUS | Status: AC | PRN
  Administered 2019-11-02: 100 mL via INTRAVENOUS

## 2019-11-02 NOTE — ED Triage Notes (Addendum)
C/o central CP x 36 hours-states she was dx with PE 01/2019 and pain feels same-pt is taking eloquis-NAD-steady gait

## 2019-11-03 ENCOUNTER — Emergency Department (HOSPITAL_BASED_OUTPATIENT_CLINIC_OR_DEPARTMENT_OTHER)
Admission: EM | Admit: 2019-11-03 | Discharge: 2019-11-03 | Disposition: A | Discharge status: Home / Self Care | Payer: BC Managed Care – PPO | Attending: Emergency Medicine | Admitting: Emergency Medicine

## 2019-11-03 DIAGNOSIS — R0789 Other chest pain: Secondary | ICD-10-CM

## 2019-11-03 DIAGNOSIS — F4323 Adjustment disorder with mixed anxiety and depressed mood: Secondary | ICD-10-CM | POA: Diagnosis not present

## 2019-11-03 HISTORY — DX: Other pulmonary embolism without acute cor pulmonale: I26.99

## 2019-11-03 NOTE — ED Provider Notes (Signed)
[facility] Provider Note: Lowella Dell, MD, FACEP  CSN: [id number] MRN: [id number] ARRIVAL: 11/02/19 at 1856 ROOM: MHFT1/MHFT1   CHIEF COMPLAINT  Chest Pain   HISTORY OF PRESENT ILLNESS  11/03/19 12:42 AM Pamela Oconnor is a 34 y.o. female with a history of pulmonary embolism on Eliquis.  She is here with central chest pain for the past 2 days.  She rates her pain as a 7 out of 10 and describes it as like the pain associated with previous pulmonary embolism.  The pain is worse with movement or deep breathing.  She rates it as a 7 out of 10.  She is not short of breath.  She has not had a fever.   Past Medical History:  Diagnosis Date  . Fracture    bilateral heels  . Lesion of brain 2011  . Leukopenia 01/13/2013  . PCOS (polycystic ovarian syndrome)   . Pulmonary embolism (HCC)   . Rash, skin 01/13/2013    History reviewed. No pertinent surgical history.  Family History  Problem Relation Age of Onset  . Graves' disease Mother   . Hypertension Mother   . Hypertension Father   . Alzheimer's disease Maternal Grandmother   . Kidney failure Paternal Grandmother   . Lupus Other     Social History   Tobacco Use  . Smoking status: Never Smoker  . Smokeless tobacco: Never Used  Vaping Use  . Vaping Use: Never used  Substance Use Topics  . Alcohol use: Yes    Comment: occ  . Drug use: No    Prior to Admission medications   Medication Sig Start Date End Date Taking? Authorizing Provider  Naproxen Sodium (ALEVE) 220 MG CAPS Take by mouth.    [provider]  TRINESSA LO 0.18/0.215/0.25 MG-25 MCG tab Take 1 tablet by mouth daily. Reported on 03/30/2015 03/14/15   [provider]    Allergies Patient has no known allergies.   REVIEW OF SYSTEMS  Negative except as noted here or in the History of Present Illness.   PHYSICAL EXAMINATION  Initial Vital Signs Blood pressure 100/77, pulse 70, temperature 98.6 F (37 C), temperature  source Oral, resp. rate 15, height 5\' 11"  (1.803 m), weight 104.3 kg, SpO2 100 %.  Examination General: Well-developed, well-nourished female in no acute distress; appearance consistent with age of record HENT: normocephalic; atraumatic Eyes: pupils equal, round and reactive to light; extraocular muscles intact Neck: supple Heart: regular rate and rhythm; no murmurs, rubs or gallops Lungs: clear to auscultation bilaterally Abdomen: soft; nondistended; nontender; no masses or hepatosplenomegaly; bowel sounds present Extremities: No deformity; full range of motion; pulses normal Neurologic: Awake, alert and oriented; motor function intact in all extremities and symmetric; no facial droop Skin: Warm and dry Psychiatric: Normal mood and affect   RESULTS  Summary of this visit's results, reviewed and interpreted by myself:   EKG Interpretation  Date/Time:  Tuesday November 02 2019 19:05:48 EDT Ventricular Rate:  83 PR Interval:  126 QRS Duration: 80 QT Interval:  366 QTC Calculation: 430 R Axis:   70 Text Interpretation: Normal sinus rhythm with sinus arrhythmia Nonspecific ST abnormality Abnormal ECG When comapred to prior, no signficant changes seen. No STEMI Confirmed by [id number] (Theda Belfast) on 11/02/2019 7:11:45 PM      Laboratory Studies: Results for orders placed or performed during the hospital encounter of 11/03/19 (from the past 24 hour(s))  Basic metabolic panel     Status: None   Collection  Time: 11/02/19  7:25 PM  Result Value Ref Range   Sodium 138 135 - 145 mmol/L   Potassium 3.7 3.5 - 5.1 mmol/L   Chloride 104 98 - 111 mmol/L   CO2 25 22 - 32 mmol/L   Glucose, Bld 93 70 - 99 mg/dL   BUN 14 6 - 20 mg/dL   Creatinine, Ser 9.47 0.44 - 1.00 mg/dL   Calcium 9.6 8.9 - 65.4 mg/dL   GFR calc non Af Amer >60 >60 mL/min   GFR calc Af Amer >60 >60 mL/min   Anion gap 9 5 - 15  CBC     Status: None   Collection Time: 11/02/19  7:25 PM  Result Value Ref Range   WBC 5.1  4.0 - 10.5 K/uL   RBC 4.62 3.87 - 5.11 MIL/uL   Hemoglobin 13.1 12.0 - 15.0 g/dL   HCT 65.0 36 - 46 %   MCV 87.4 80.0 - 100.0 fL   MCH 28.4 26.0 - 34.0 pg   MCHC 32.4 30.0 - 36.0 g/dL   RDW 35.4 65.6 - 81.2 %   Platelets 288 150 - 400 K/uL   nRBC 0.0 0.0 - 0.2 %  Troponin I (High Sensitivity)     Status: None   Collection Time: 11/02/19  7:25 PM  Result Value Ref Range   Troponin I (High Sensitivity) 2 <18 ng/L  Pregnancy, urine     Status: None   Collection Time: 11/02/19  7:25 PM  Result Value Ref Range   Preg Test, Ur NEGATIVE NEGATIVE   Imaging Studies: CT Angio Chest PE W and/or Wo Contrast  Result Date: 11/02/2019 CLINICAL DATA:  Central chest pain 436 hours, lower extremity edema, previous pulmonary embolus EXAM: CT ANGIOGRAPHY CHEST WITH CONTRAST TECHNIQUE: Multidetector CT imaging of the chest was performed using the standard protocol during bolus administration of intravenous contrast. Multiplanar CT image reconstructions and MIPs were obtained to evaluate the vascular anatomy. CONTRAST:  OMNIPAQUE IOHEXOL 350 MG/ML SOLN COMPARISON:  None. FINDINGS: Cardiovascular: This is a technically adequate evaluation of the pulmonary vasculature. No filling defects or pulmonary emboli. The heart is unremarkable without pericardial effusion. No thoracic aortic aneurysm or dissection. Mediastinum/Nodes: No enlarged mediastinal, hilar, or axillary lymph nodes. Thyroid gland, trachea, and esophagus demonstrate no significant findings. Soft tissue within the anterior mediastinum likely reflects residual thymus. Lungs/Pleura: No airspace disease, effusion, or pneumothorax. Central airways are patent. Upper Abdomen: No acute abnormality. Musculoskeletal: No acute or destructive bony lesions. Reconstructed images demonstrate no additional findings. Review of the MIP images confirms the above findings. IMPRESSION: 1. No evidence of pulmonary embolus. 2. No acute intrathoracic process.  Electronically Signed   By: Sharlet Salina M.D.   On: 11/02/2019 20:44    ED COURSE and MDM  Nursing notes, initial and subsequent vitals signs, including pulse oximetry, reviewed and interpreted by myself.  Vitals:   11/02/19 1907 11/02/19 2226  BP: 134/79 100/77  Pulse: 84 70  Resp: 20 15  Temp: 98.6 F (37 C)   TempSrc: Oral   SpO2: 100% 100%  Weight: 104.3 kg   Height: 5\' 11"  (1.803 m)    Medications  iohexol (OMNIPAQUE) 350 MG/ML injection 100 mL (100 mLs Intravenous Contrast Given 11/02/19 2024)   The patient's chest wall pain is reproducible.  Her EKG, troponin and CT angio chest are all reassuring.  No evidence of pulmonary embolism or acute coronary syndrome.  PROCEDURES  Procedures   ED DIAGNOSES  ICD-10-CM   1. Chest wall pain  R07.89        Paula Libra, MD 11/03/19 [phone]

## 2019-11-12 DIAGNOSIS — I340 Nonrheumatic mitral (valve) insufficiency: Secondary | ICD-10-CM | POA: Diagnosis not present

## 2019-11-12 DIAGNOSIS — I361 Nonrheumatic tricuspid (valve) insufficiency: Secondary | ICD-10-CM | POA: Diagnosis not present

## 2019-11-17 DIAGNOSIS — F4323 Adjustment disorder with mixed anxiety and depressed mood: Secondary | ICD-10-CM | POA: Diagnosis not present

## 2019-12-04 ENCOUNTER — Other Ambulatory Visit: Payer: BC Managed Care – PPO

## 2019-12-04 ENCOUNTER — Other Ambulatory Visit: Payer: Self-pay

## 2019-12-04 DIAGNOSIS — Z20822 Contact with and (suspected) exposure to covid-19: Secondary | ICD-10-CM

## 2019-12-06 LAB — SARS-COV-2, NAA 2 DAY TAT

## 2019-12-06 LAB — NOVEL CORONAVIRUS, NAA: SARS-CoV-2, NAA: NOT DETECTED

## 2019-12-07 DIAGNOSIS — F4323 Adjustment disorder with mixed anxiety and depressed mood: Secondary | ICD-10-CM | POA: Diagnosis not present

## 2019-12-27 DIAGNOSIS — Z6832 Body mass index (BMI) 32.0-32.9, adult: Secondary | ICD-10-CM | POA: Diagnosis not present

## 2019-12-27 DIAGNOSIS — I2699 Other pulmonary embolism without acute cor pulmonale: Secondary | ICD-10-CM | POA: Diagnosis not present

## 2019-12-28 DIAGNOSIS — F4323 Adjustment disorder with mixed anxiety and depressed mood: Secondary | ICD-10-CM | POA: Diagnosis not present

## 2019-12-30 DIAGNOSIS — Z23 Encounter for immunization: Secondary | ICD-10-CM | POA: Diagnosis not present

## 2019-12-30 DIAGNOSIS — Z0000 Encounter for general adult medical examination without abnormal findings: Secondary | ICD-10-CM | POA: Diagnosis not present

## 2019-12-30 DIAGNOSIS — E7841 Elevated Lipoprotein(a): Secondary | ICD-10-CM | POA: Diagnosis not present

## 2019-12-30 DIAGNOSIS — D72819 Decreased white blood cell count, unspecified: Secondary | ICD-10-CM | POA: Diagnosis not present

## 2020-01-25 DIAGNOSIS — F4323 Adjustment disorder with mixed anxiety and depressed mood: Secondary | ICD-10-CM | POA: Diagnosis not present

## 2020-03-07 DIAGNOSIS — F4323 Adjustment disorder with mixed anxiety and depressed mood: Secondary | ICD-10-CM | POA: Diagnosis not present

## 2020-03-31 DIAGNOSIS — F4323 Adjustment disorder with mixed anxiety and depressed mood: Secondary | ICD-10-CM | POA: Diagnosis not present

## 2020-04-20 DIAGNOSIS — F4323 Adjustment disorder with mixed anxiety and depressed mood: Secondary | ICD-10-CM | POA: Diagnosis not present

## 2020-05-09 DIAGNOSIS — F4323 Adjustment disorder with mixed anxiety and depressed mood: Secondary | ICD-10-CM | POA: Diagnosis not present

## 2020-05-26 DIAGNOSIS — F4323 Adjustment disorder with mixed anxiety and depressed mood: Secondary | ICD-10-CM | POA: Diagnosis not present

## 2020-06-13 DIAGNOSIS — F4323 Adjustment disorder with mixed anxiety and depressed mood: Secondary | ICD-10-CM | POA: Diagnosis not present

## 2020-06-21 DIAGNOSIS — R309 Painful micturition, unspecified: Secondary | ICD-10-CM | POA: Diagnosis not present

## 2020-06-21 DIAGNOSIS — R102 Pelvic and perineal pain: Secondary | ICD-10-CM | POA: Diagnosis not present

## 2020-06-26 DIAGNOSIS — I2699 Other pulmonary embolism without acute cor pulmonale: Secondary | ICD-10-CM | POA: Diagnosis not present

## 2020-06-26 DIAGNOSIS — Z6832 Body mass index (BMI) 32.0-32.9, adult: Secondary | ICD-10-CM | POA: Diagnosis not present

## 2020-06-28 DIAGNOSIS — R102 Pelvic and perineal pain: Secondary | ICD-10-CM | POA: Diagnosis not present

## 2020-06-28 DIAGNOSIS — F4323 Adjustment disorder with mixed anxiety and depressed mood: Secondary | ICD-10-CM | POA: Diagnosis not present

## 2020-07-07 DIAGNOSIS — M9901 Segmental and somatic dysfunction of cervical region: Secondary | ICD-10-CM | POA: Diagnosis not present

## 2020-07-07 DIAGNOSIS — M9904 Segmental and somatic dysfunction of sacral region: Secondary | ICD-10-CM | POA: Diagnosis not present

## 2020-07-07 DIAGNOSIS — M9905 Segmental and somatic dysfunction of pelvic region: Secondary | ICD-10-CM | POA: Diagnosis not present

## 2020-07-07 DIAGNOSIS — M9903 Segmental and somatic dysfunction of lumbar region: Secondary | ICD-10-CM | POA: Diagnosis not present

## 2020-07-26 DIAGNOSIS — F4323 Adjustment disorder with mixed anxiety and depressed mood: Secondary | ICD-10-CM | POA: Diagnosis not present

## 2020-08-09 DIAGNOSIS — F4323 Adjustment disorder with mixed anxiety and depressed mood: Secondary | ICD-10-CM | POA: Diagnosis not present

## 2020-08-15 DIAGNOSIS — Z6832 Body mass index (BMI) 32.0-32.9, adult: Secondary | ICD-10-CM | POA: Diagnosis not present

## 2020-08-15 DIAGNOSIS — Z01419 Encounter for gynecological examination (general) (routine) without abnormal findings: Secondary | ICD-10-CM | POA: Diagnosis not present

## 2020-08-15 DIAGNOSIS — Z319 Encounter for procreative management, unspecified: Secondary | ICD-10-CM | POA: Diagnosis not present

## 2020-08-30 DIAGNOSIS — F4323 Adjustment disorder with mixed anxiety and depressed mood: Secondary | ICD-10-CM | POA: Diagnosis not present

## 2020-09-12 DIAGNOSIS — D251 Intramural leiomyoma of uterus: Secondary | ICD-10-CM | POA: Diagnosis not present

## 2020-09-12 DIAGNOSIS — Z319 Encounter for procreative management, unspecified: Secondary | ICD-10-CM | POA: Diagnosis not present

## 2020-09-12 DIAGNOSIS — N979 Female infertility, unspecified: Secondary | ICD-10-CM | POA: Diagnosis not present

## 2020-09-13 DIAGNOSIS — F4323 Adjustment disorder with mixed anxiety and depressed mood: Secondary | ICD-10-CM | POA: Diagnosis not present

## 2020-09-28 DIAGNOSIS — R059 Cough, unspecified: Secondary | ICD-10-CM | POA: Diagnosis not present

## 2020-09-28 DIAGNOSIS — R509 Fever, unspecified: Secondary | ICD-10-CM | POA: Diagnosis not present

## 2020-09-28 DIAGNOSIS — J029 Acute pharyngitis, unspecified: Secondary | ICD-10-CM | POA: Diagnosis not present

## 2020-09-28 DIAGNOSIS — Z03818 Encounter for observation for suspected exposure to other biological agents ruled out: Secondary | ICD-10-CM | POA: Diagnosis not present

## 2020-09-29 DIAGNOSIS — Z6832 Body mass index (BMI) 32.0-32.9, adult: Secondary | ICD-10-CM | POA: Diagnosis not present

## 2020-09-29 DIAGNOSIS — J069 Acute upper respiratory infection, unspecified: Secondary | ICD-10-CM | POA: Diagnosis not present

## 2020-10-12 DIAGNOSIS — I2699 Other pulmonary embolism without acute cor pulmonale: Secondary | ICD-10-CM | POA: Diagnosis not present

## 2020-10-12 DIAGNOSIS — B9789 Other viral agents as the cause of diseases classified elsewhere: Secondary | ICD-10-CM | POA: Diagnosis not present

## 2020-10-12 DIAGNOSIS — J028 Acute pharyngitis due to other specified organisms: Secondary | ICD-10-CM | POA: Diagnosis not present

## 2020-10-12 DIAGNOSIS — E282 Polycystic ovarian syndrome: Secondary | ICD-10-CM | POA: Diagnosis not present

## 2020-10-12 DIAGNOSIS — J029 Acute pharyngitis, unspecified: Secondary | ICD-10-CM | POA: Diagnosis not present

## 2020-12-07 DIAGNOSIS — R6889 Other general symptoms and signs: Secondary | ICD-10-CM | POA: Diagnosis not present

## 2020-12-07 DIAGNOSIS — R058 Other specified cough: Secondary | ICD-10-CM | POA: Diagnosis not present

## 2020-12-07 DIAGNOSIS — H6692 Otitis media, unspecified, left ear: Secondary | ICD-10-CM | POA: Diagnosis not present

## 2020-12-07 DIAGNOSIS — Z6832 Body mass index (BMI) 32.0-32.9, adult: Secondary | ICD-10-CM | POA: Diagnosis not present

## 2020-12-07 DIAGNOSIS — J189 Pneumonia, unspecified organism: Secondary | ICD-10-CM | POA: Diagnosis not present

## 2020-12-11 DIAGNOSIS — U071 COVID-19: Secondary | ICD-10-CM | POA: Diagnosis not present

## 2020-12-11 DIAGNOSIS — Z20822 Contact with and (suspected) exposure to covid-19: Secondary | ICD-10-CM | POA: Diagnosis not present

## 2020-12-22 DIAGNOSIS — E669 Obesity, unspecified: Secondary | ICD-10-CM | POA: Diagnosis not present

## 2020-12-22 DIAGNOSIS — G4719 Other hypersomnia: Secondary | ICD-10-CM | POA: Diagnosis not present

## 2021-01-02 DIAGNOSIS — F4323 Adjustment disorder with mixed anxiety and depressed mood: Secondary | ICD-10-CM | POA: Diagnosis not present

## 2021-01-03 DIAGNOSIS — D72819 Decreased white blood cell count, unspecified: Secondary | ICD-10-CM | POA: Diagnosis not present

## 2021-01-03 DIAGNOSIS — R739 Hyperglycemia, unspecified: Secondary | ICD-10-CM | POA: Diagnosis not present

## 2021-01-03 DIAGNOSIS — E7849 Other hyperlipidemia: Secondary | ICD-10-CM | POA: Diagnosis not present

## 2021-01-03 DIAGNOSIS — Z8669 Personal history of other diseases of the nervous system and sense organs: Secondary | ICD-10-CM | POA: Diagnosis not present

## 2021-01-03 DIAGNOSIS — E7841 Elevated Lipoprotein(a): Secondary | ICD-10-CM | POA: Diagnosis not present

## 2021-01-03 DIAGNOSIS — Z0000 Encounter for general adult medical examination without abnormal findings: Secondary | ICD-10-CM | POA: Diagnosis not present

## 2021-01-16 DIAGNOSIS — G4733 Obstructive sleep apnea (adult) (pediatric): Secondary | ICD-10-CM | POA: Diagnosis not present

## 2021-01-18 DIAGNOSIS — G4733 Obstructive sleep apnea (adult) (pediatric): Secondary | ICD-10-CM | POA: Diagnosis not present

## 2021-01-18 DIAGNOSIS — E669 Obesity, unspecified: Secondary | ICD-10-CM | POA: Diagnosis not present

## 2021-01-18 DIAGNOSIS — F4323 Adjustment disorder with mixed anxiety and depressed mood: Secondary | ICD-10-CM | POA: Diagnosis not present

## 2021-01-26 DIAGNOSIS — F4323 Adjustment disorder with mixed anxiety and depressed mood: Secondary | ICD-10-CM | POA: Diagnosis not present

## 2021-02-11 DIAGNOSIS — Z20828 Contact with and (suspected) exposure to other viral communicable diseases: Secondary | ICD-10-CM | POA: Diagnosis not present

## 2021-02-11 DIAGNOSIS — J09X2 Influenza due to identified novel influenza A virus with other respiratory manifestations: Secondary | ICD-10-CM | POA: Diagnosis not present

## 2021-02-11 DIAGNOSIS — R110 Nausea: Secondary | ICD-10-CM | POA: Diagnosis not present

## 2021-02-13 DIAGNOSIS — R079 Chest pain, unspecified: Secondary | ICD-10-CM | POA: Diagnosis not present

## 2021-02-13 DIAGNOSIS — R072 Precordial pain: Secondary | ICD-10-CM | POA: Diagnosis not present

## 2021-02-14 DIAGNOSIS — R079 Chest pain, unspecified: Secondary | ICD-10-CM | POA: Diagnosis not present

## 2021-02-20 DIAGNOSIS — Z30012 Encounter for prescription of emergency contraception: Secondary | ICD-10-CM | POA: Diagnosis not present

## 2021-03-01 DIAGNOSIS — F4323 Adjustment disorder with mixed anxiety and depressed mood: Secondary | ICD-10-CM | POA: Diagnosis not present

## 2021-03-14 DIAGNOSIS — Z3202 Encounter for pregnancy test, result negative: Secondary | ICD-10-CM | POA: Diagnosis not present

## 2021-03-14 DIAGNOSIS — Z3041 Encounter for surveillance of contraceptive pills: Secondary | ICD-10-CM | POA: Diagnosis not present

## 2021-03-14 DIAGNOSIS — N760 Acute vaginitis: Secondary | ICD-10-CM | POA: Diagnosis not present

## 2021-03-14 DIAGNOSIS — Z3009 Encounter for other general counseling and advice on contraception: Secondary | ICD-10-CM | POA: Diagnosis not present

## 2021-03-14 DIAGNOSIS — N390 Urinary tract infection, site not specified: Secondary | ICD-10-CM | POA: Diagnosis not present

## 2021-03-20 DIAGNOSIS — F4323 Adjustment disorder with mixed anxiety and depressed mood: Secondary | ICD-10-CM | POA: Diagnosis not present

## 2021-03-28 DIAGNOSIS — F4323 Adjustment disorder with mixed anxiety and depressed mood: Secondary | ICD-10-CM | POA: Diagnosis not present

## 2021-04-12 DIAGNOSIS — F4323 Adjustment disorder with mixed anxiety and depressed mood: Secondary | ICD-10-CM | POA: Diagnosis not present

## 2021-05-04 DIAGNOSIS — F4323 Adjustment disorder with mixed anxiety and depressed mood: Secondary | ICD-10-CM | POA: Diagnosis not present

## 2021-05-18 DIAGNOSIS — F4323 Adjustment disorder with mixed anxiety and depressed mood: Secondary | ICD-10-CM | POA: Diagnosis not present

## 2021-05-29 DIAGNOSIS — F4323 Adjustment disorder with mixed anxiety and depressed mood: Secondary | ICD-10-CM | POA: Diagnosis not present

## 2021-06-19 DIAGNOSIS — F4323 Adjustment disorder with mixed anxiety and depressed mood: Secondary | ICD-10-CM | POA: Diagnosis not present

## 2021-07-03 DIAGNOSIS — F4323 Adjustment disorder with mixed anxiety and depressed mood: Secondary | ICD-10-CM | POA: Diagnosis not present

## 2021-07-04 IMAGING — CT CT ANGIO CHEST
2 of 8 series · 19 of 36 positions shown · IV contrast (Omnipaque)
Comparison: None.

CLINICAL DATA: Central chest pain 436 hours, lower extremity edema,
previous pulmonary embolus

EXAM:
CT ANGIOGRAPHY CHEST WITH CONTRAST
TECHNIQUE: Multidetector CT imaging of the chest was performed using the
standard protocol during bolus administration of intravenous
contrast. Multiplanar CT image reconstructions and MIPs were
obtained to evaluate the vascular anatomy.
CONTRAST:  100mL OMNIPAQUE IOHEXOL 350 MG/ML SOLN

[Series 6: pe thins · axial · 0.72mm/px · z∈[-176,+70]mm · 18 of 277 slices shown]
[im 15/277  lung]
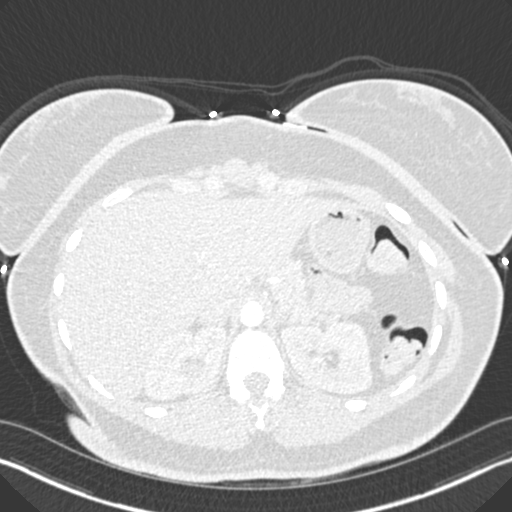
[im 30/277  mediastinal]
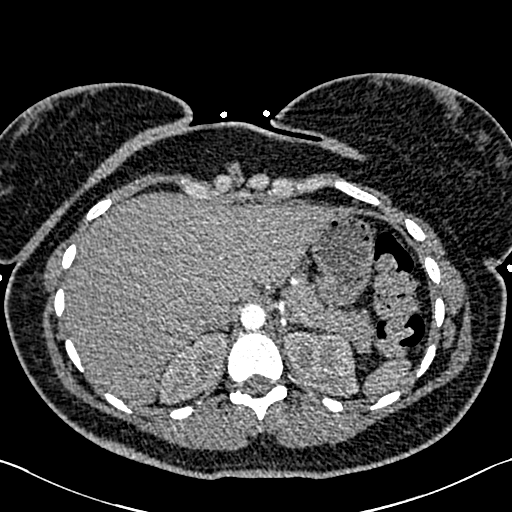
[im 44/277  lung]
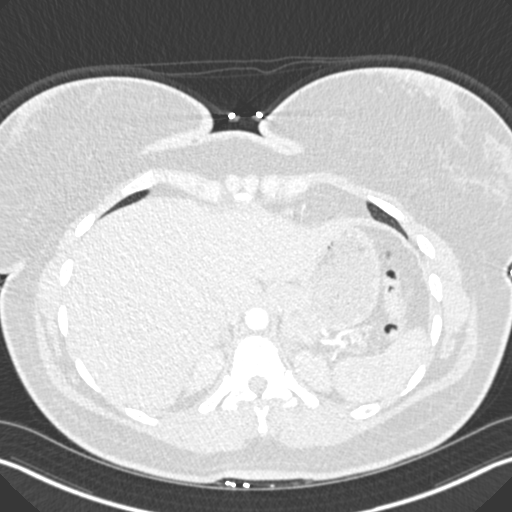
[im 59/277  mediastinal]
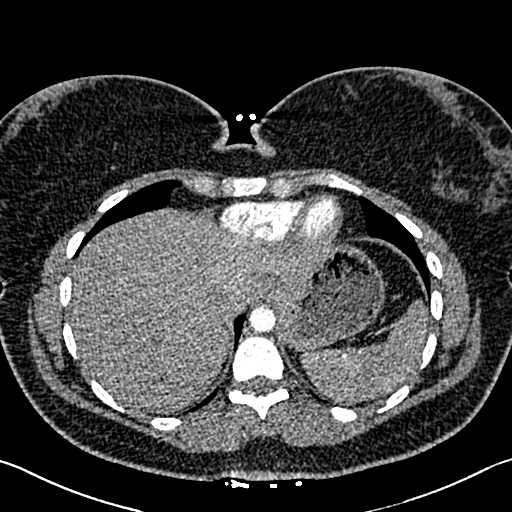
[im 73/277  lung]
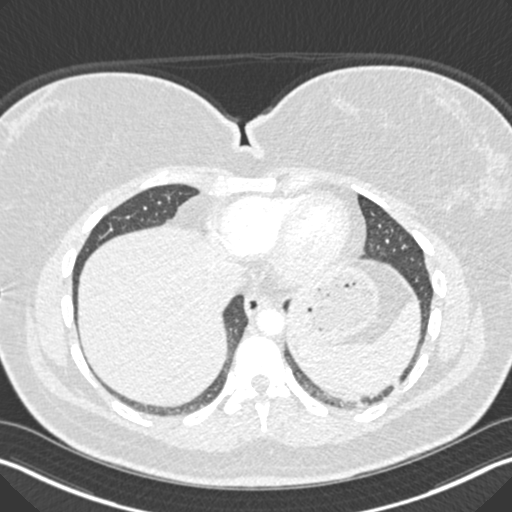
[im 88/277  mediastinal]
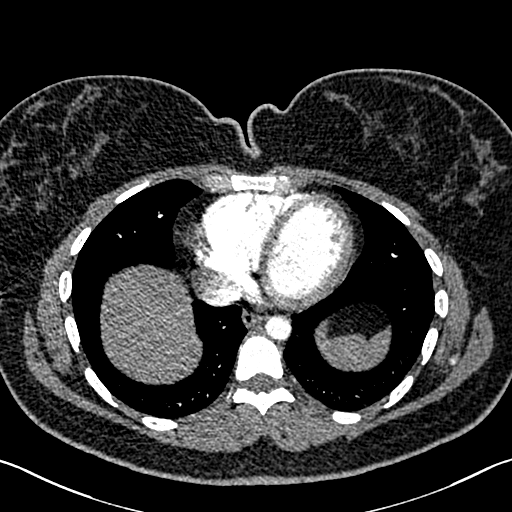
[im 102/277  lung]
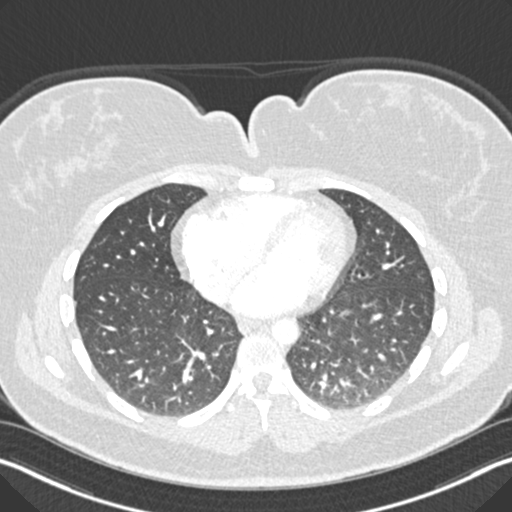
[im 117/277  mediastinal]
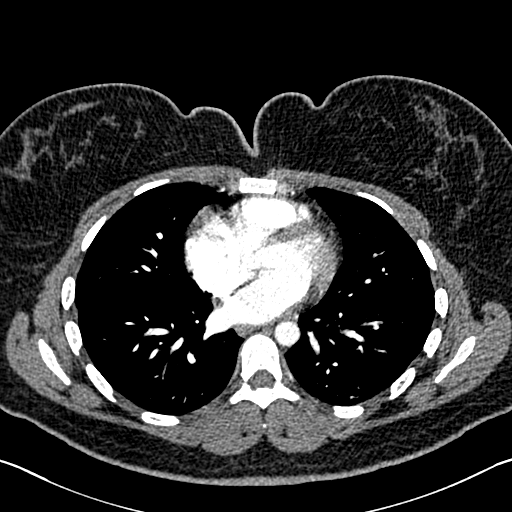
[im 131/277  lung]
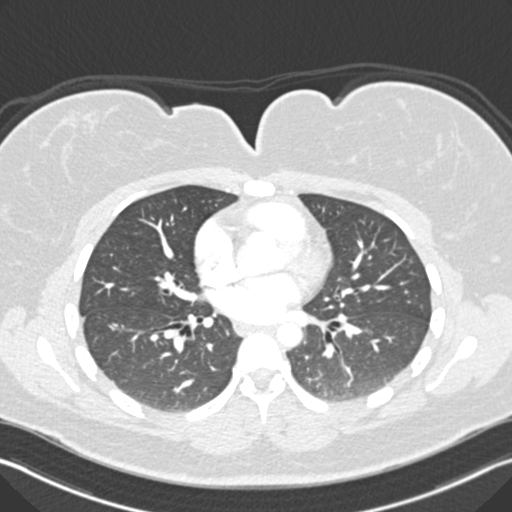
[im 146/277  mediastinal]
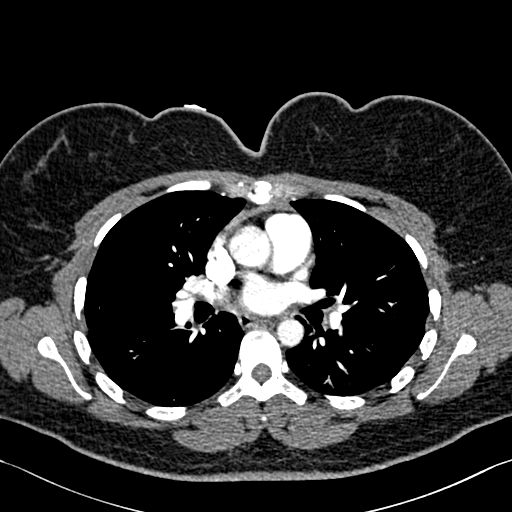
[im 160/277  lung]
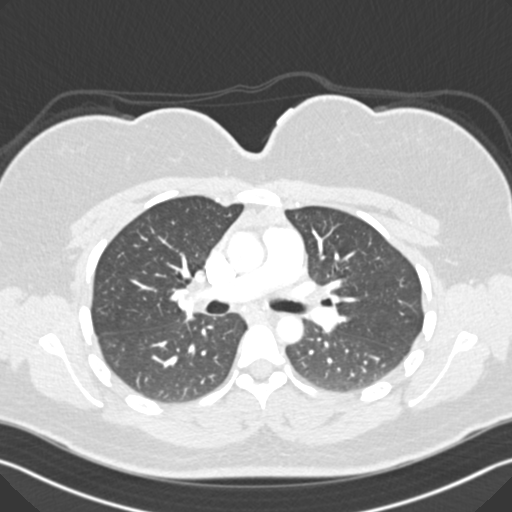
[im 175/277  mediastinal]
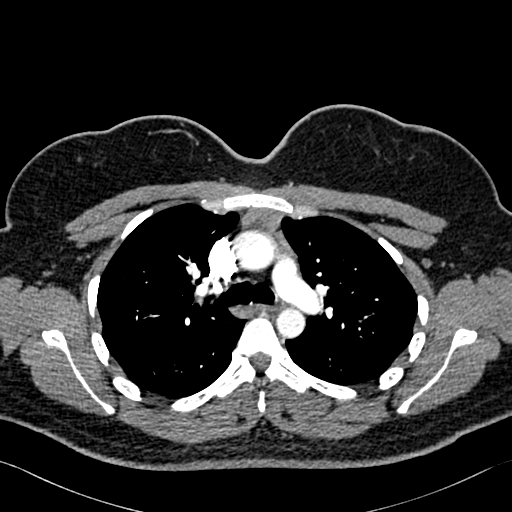
[im 189/277  lung]
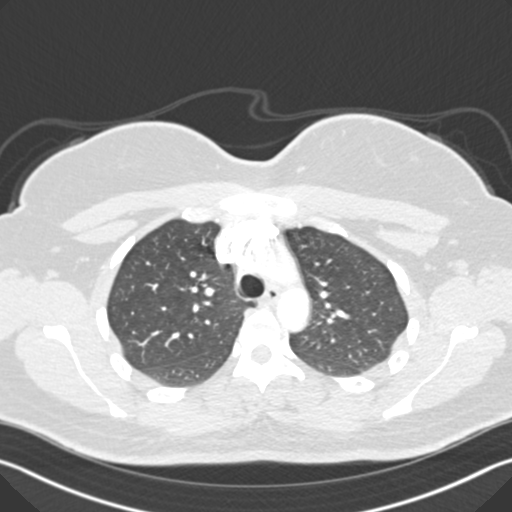
[im 204/277  mediastinal]
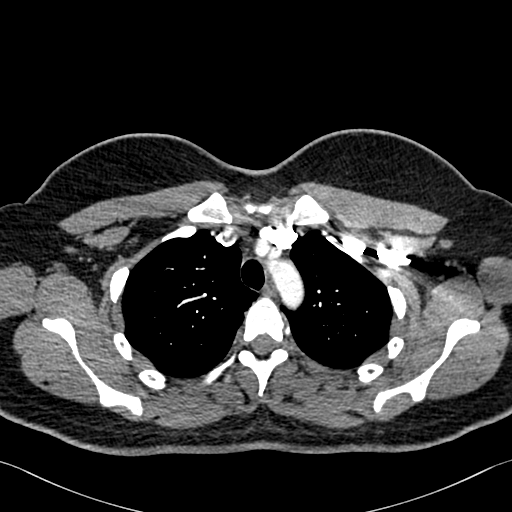
[im 218/277  lung]
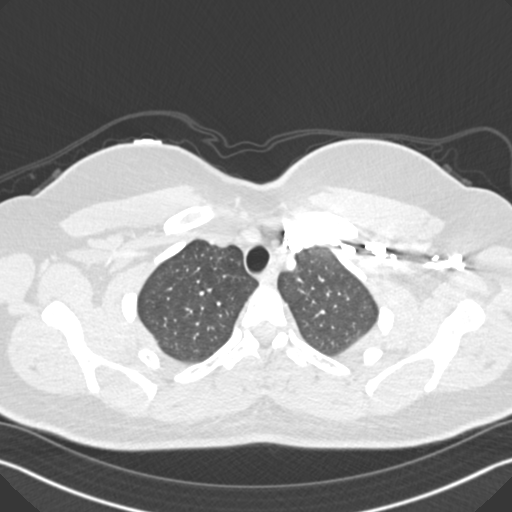
[im 233/277  mediastinal]
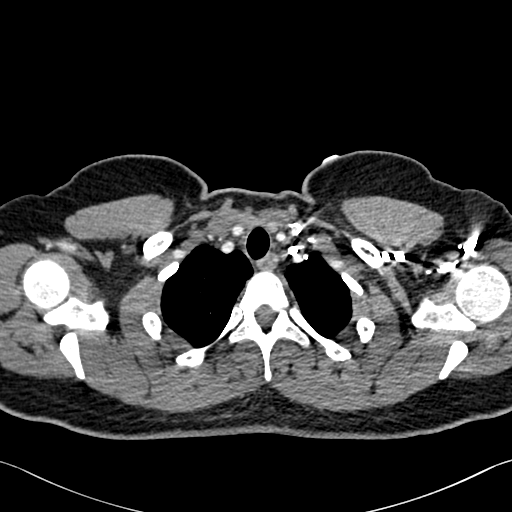
[im 247/277  lung]
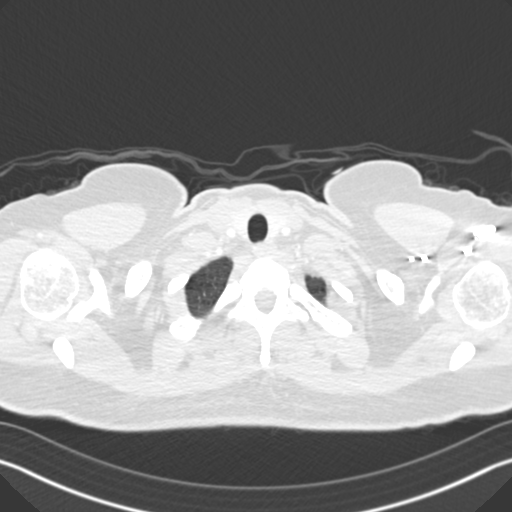
[im 262/277  mediastinal]
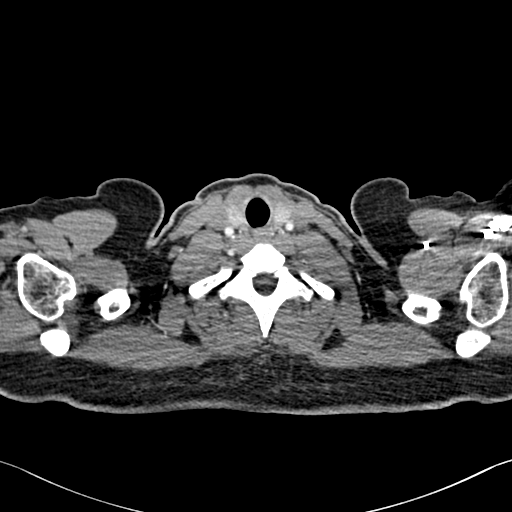

[Series 7: pe coronal mpr · coronal · 0.59mm/px · 1 of 116 slices shown]
[im 58/116  mediastinal]
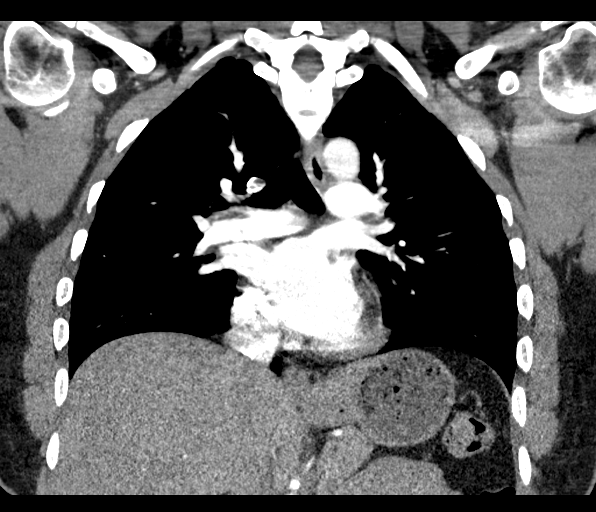

[19 of 36 positions shown; findings below may reference images not displayed]

FINDINGS: Cardiovascular: This is a technically adequate evaluation of the
pulmonary vasculature. No filling defects or pulmonary emboli.

The heart is unremarkable without pericardial effusion. No thoracic
aortic aneurysm or dissection.

Mediastinum/Nodes: No enlarged mediastinal, hilar, or axillary lymph
nodes. Thyroid gland, trachea, and esophagus demonstrate no
significant findings. Soft tissue within the anterior mediastinum
likely reflects residual thymus.

Lungs/Pleura: No airspace disease, effusion, or pneumothorax.
Central airways are patent.

Upper Abdomen: No acute abnormality.

Musculoskeletal: No acute or destructive bony lesions. Reconstructed
images demonstrate no additional findings.

Review of the MIP images confirms the above findings.
IMPRESSION: 1. No evidence of pulmonary embolus.
2. No acute intrathoracic process.

## 2021-08-24 DIAGNOSIS — F4323 Adjustment disorder with mixed anxiety and depressed mood: Secondary | ICD-10-CM | POA: Diagnosis not present

## 2021-08-30 DIAGNOSIS — G4733 Obstructive sleep apnea (adult) (pediatric): Secondary | ICD-10-CM | POA: Diagnosis not present

## 2021-09-07 DIAGNOSIS — N912 Amenorrhea, unspecified: Secondary | ICD-10-CM | POA: Diagnosis not present

## 2021-09-07 DIAGNOSIS — E282 Polycystic ovarian syndrome: Secondary | ICD-10-CM | POA: Diagnosis not present

## 2021-09-07 DIAGNOSIS — R5383 Other fatigue: Secondary | ICD-10-CM | POA: Diagnosis not present

## 2021-09-14 DIAGNOSIS — F4323 Adjustment disorder with mixed anxiety and depressed mood: Secondary | ICD-10-CM | POA: Diagnosis not present

## 2021-09-24 DIAGNOSIS — R0602 Shortness of breath: Secondary | ICD-10-CM | POA: Diagnosis not present

## 2021-09-24 DIAGNOSIS — R918 Other nonspecific abnormal finding of lung field: Secondary | ICD-10-CM | POA: Diagnosis not present

## 2021-09-24 DIAGNOSIS — R079 Chest pain, unspecified: Secondary | ICD-10-CM | POA: Diagnosis not present

## 2021-09-24 DIAGNOSIS — Z6833 Body mass index (BMI) 33.0-33.9, adult: Secondary | ICD-10-CM | POA: Diagnosis not present

## 2021-09-24 DIAGNOSIS — M79602 Pain in left arm: Secondary | ICD-10-CM | POA: Diagnosis not present

## 2021-09-24 DIAGNOSIS — R0789 Other chest pain: Secondary | ICD-10-CM | POA: Diagnosis not present

## 2021-09-24 DIAGNOSIS — Z86711 Personal history of pulmonary embolism: Secondary | ICD-10-CM | POA: Diagnosis not present

## 2021-09-24 DIAGNOSIS — M25512 Pain in left shoulder: Secondary | ICD-10-CM | POA: Diagnosis not present

## 2021-09-24 DIAGNOSIS — J029 Acute pharyngitis, unspecified: Secondary | ICD-10-CM | POA: Diagnosis not present

## 2021-09-24 DIAGNOSIS — R071 Chest pain on breathing: Secondary | ICD-10-CM | POA: Diagnosis not present

## 2021-09-24 DIAGNOSIS — R600 Localized edema: Secondary | ICD-10-CM | POA: Diagnosis not present

## 2021-09-28 DIAGNOSIS — F4323 Adjustment disorder with mixed anxiety and depressed mood: Secondary | ICD-10-CM | POA: Diagnosis not present

## 2021-10-08 DIAGNOSIS — J00 Acute nasopharyngitis [common cold]: Secondary | ICD-10-CM | POA: Diagnosis not present

## 2021-10-08 DIAGNOSIS — Z6833 Body mass index (BMI) 33.0-33.9, adult: Secondary | ICD-10-CM | POA: Diagnosis not present

## 2021-10-08 DIAGNOSIS — Z03818 Encounter for observation for suspected exposure to other biological agents ruled out: Secondary | ICD-10-CM | POA: Diagnosis not present

## 2021-10-08 DIAGNOSIS — R52 Pain, unspecified: Secondary | ICD-10-CM | POA: Diagnosis not present

## 2021-10-10 DIAGNOSIS — G4733 Obstructive sleep apnea (adult) (pediatric): Secondary | ICD-10-CM | POA: Diagnosis not present

## 2021-10-12 DIAGNOSIS — F4323 Adjustment disorder with mixed anxiety and depressed mood: Secondary | ICD-10-CM | POA: Diagnosis not present

## 2021-10-25 DIAGNOSIS — E7849 Other hyperlipidemia: Secondary | ICD-10-CM | POA: Diagnosis not present

## 2021-10-25 DIAGNOSIS — Z1389 Encounter for screening for other disorder: Secondary | ICD-10-CM | POA: Diagnosis not present

## 2021-10-25 DIAGNOSIS — Z6834 Body mass index (BMI) 34.0-34.9, adult: Secondary | ICD-10-CM | POA: Diagnosis not present

## 2021-10-25 DIAGNOSIS — R5383 Other fatigue: Secondary | ICD-10-CM | POA: Diagnosis not present

## 2021-10-25 DIAGNOSIS — E669 Obesity, unspecified: Secondary | ICD-10-CM | POA: Diagnosis not present

## 2021-10-25 DIAGNOSIS — G4733 Obstructive sleep apnea (adult) (pediatric): Secondary | ICD-10-CM | POA: Diagnosis not present

## 2021-10-25 DIAGNOSIS — E282 Polycystic ovarian syndrome: Secondary | ICD-10-CM | POA: Diagnosis not present

## 2021-10-26 DIAGNOSIS — F4323 Adjustment disorder with mixed anxiety and depressed mood: Secondary | ICD-10-CM | POA: Diagnosis not present

## 2021-11-10 DIAGNOSIS — G4733 Obstructive sleep apnea (adult) (pediatric): Secondary | ICD-10-CM | POA: Diagnosis not present

## 2021-11-15 DIAGNOSIS — E669 Obesity, unspecified: Secondary | ICD-10-CM | POA: Diagnosis not present

## 2021-11-15 DIAGNOSIS — F4329 Adjustment disorder with other symptoms: Secondary | ICD-10-CM | POA: Diagnosis not present

## 2021-11-15 DIAGNOSIS — G4733 Obstructive sleep apnea (adult) (pediatric): Secondary | ICD-10-CM | POA: Diagnosis not present

## 2021-11-16 DIAGNOSIS — F4323 Adjustment disorder with mixed anxiety and depressed mood: Secondary | ICD-10-CM | POA: Diagnosis not present

## 2021-11-30 DIAGNOSIS — F4323 Adjustment disorder with mixed anxiety and depressed mood: Secondary | ICD-10-CM | POA: Diagnosis not present

## 2021-11-30 DIAGNOSIS — I8002 Phlebitis and thrombophlebitis of superficial vessels of left lower extremity: Secondary | ICD-10-CM | POA: Diagnosis not present

## 2021-11-30 DIAGNOSIS — M2141 Flat foot [pes planus] (acquired), right foot: Secondary | ICD-10-CM | POA: Diagnosis not present

## 2021-11-30 DIAGNOSIS — M792 Neuralgia and neuritis, unspecified: Secondary | ICD-10-CM | POA: Diagnosis not present

## 2021-12-03 DIAGNOSIS — Z01419 Encounter for gynecological examination (general) (routine) without abnormal findings: Secondary | ICD-10-CM | POA: Diagnosis not present

## 2021-12-03 DIAGNOSIS — Z124 Encounter for screening for malignant neoplasm of cervix: Secondary | ICD-10-CM | POA: Diagnosis not present

## 2021-12-03 DIAGNOSIS — Z6834 Body mass index (BMI) 34.0-34.9, adult: Secondary | ICD-10-CM | POA: Diagnosis not present

## 2021-12-03 DIAGNOSIS — Z113 Encounter for screening for infections with a predominantly sexual mode of transmission: Secondary | ICD-10-CM | POA: Diagnosis not present

## 2021-12-03 DIAGNOSIS — Z1151 Encounter for screening for human papillomavirus (HPV): Secondary | ICD-10-CM | POA: Diagnosis not present

## 2021-12-04 DIAGNOSIS — G4733 Obstructive sleep apnea (adult) (pediatric): Secondary | ICD-10-CM | POA: Diagnosis not present

## 2021-12-04 DIAGNOSIS — E669 Obesity, unspecified: Secondary | ICD-10-CM | POA: Diagnosis not present

## 2021-12-06 DIAGNOSIS — Z6833 Body mass index (BMI) 33.0-33.9, adult: Secondary | ICD-10-CM | POA: Diagnosis not present

## 2021-12-06 DIAGNOSIS — I2699 Other pulmonary embolism without acute cor pulmonale: Secondary | ICD-10-CM | POA: Diagnosis not present

## 2021-12-11 DIAGNOSIS — G4733 Obstructive sleep apnea (adult) (pediatric): Secondary | ICD-10-CM | POA: Diagnosis not present

## 2021-12-12 DIAGNOSIS — E282 Polycystic ovarian syndrome: Secondary | ICD-10-CM | POA: Diagnosis not present

## 2021-12-12 DIAGNOSIS — E669 Obesity, unspecified: Secondary | ICD-10-CM | POA: Diagnosis not present

## 2021-12-12 DIAGNOSIS — E7849 Other hyperlipidemia: Secondary | ICD-10-CM | POA: Diagnosis not present

## 2021-12-21 DIAGNOSIS — F4323 Adjustment disorder with mixed anxiety and depressed mood: Secondary | ICD-10-CM | POA: Diagnosis not present

## 2021-12-24 DIAGNOSIS — M9905 Segmental and somatic dysfunction of pelvic region: Secondary | ICD-10-CM | POA: Diagnosis not present

## 2021-12-24 DIAGNOSIS — M9904 Segmental and somatic dysfunction of sacral region: Secondary | ICD-10-CM | POA: Diagnosis not present

## 2021-12-24 DIAGNOSIS — M9903 Segmental and somatic dysfunction of lumbar region: Secondary | ICD-10-CM | POA: Diagnosis not present

## 2021-12-24 DIAGNOSIS — M9901 Segmental and somatic dysfunction of cervical region: Secondary | ICD-10-CM | POA: Diagnosis not present

## 2021-12-26 DIAGNOSIS — M9904 Segmental and somatic dysfunction of sacral region: Secondary | ICD-10-CM | POA: Diagnosis not present

## 2021-12-26 DIAGNOSIS — M9905 Segmental and somatic dysfunction of pelvic region: Secondary | ICD-10-CM | POA: Diagnosis not present

## 2021-12-26 DIAGNOSIS — M9901 Segmental and somatic dysfunction of cervical region: Secondary | ICD-10-CM | POA: Diagnosis not present

## 2021-12-26 DIAGNOSIS — M9903 Segmental and somatic dysfunction of lumbar region: Secondary | ICD-10-CM | POA: Diagnosis not present

## 2021-12-31 DIAGNOSIS — M9903 Segmental and somatic dysfunction of lumbar region: Secondary | ICD-10-CM | POA: Diagnosis not present

## 2021-12-31 DIAGNOSIS — M9901 Segmental and somatic dysfunction of cervical region: Secondary | ICD-10-CM | POA: Diagnosis not present

## 2021-12-31 DIAGNOSIS — M9905 Segmental and somatic dysfunction of pelvic region: Secondary | ICD-10-CM | POA: Diagnosis not present

## 2021-12-31 DIAGNOSIS — M9904 Segmental and somatic dysfunction of sacral region: Secondary | ICD-10-CM | POA: Diagnosis not present

## 2022-01-02 DIAGNOSIS — E669 Obesity, unspecified: Secondary | ICD-10-CM | POA: Diagnosis not present

## 2022-01-02 DIAGNOSIS — M9901 Segmental and somatic dysfunction of cervical region: Secondary | ICD-10-CM | POA: Diagnosis not present

## 2022-01-02 DIAGNOSIS — M9904 Segmental and somatic dysfunction of sacral region: Secondary | ICD-10-CM | POA: Diagnosis not present

## 2022-01-02 DIAGNOSIS — G4733 Obstructive sleep apnea (adult) (pediatric): Secondary | ICD-10-CM | POA: Diagnosis not present

## 2022-01-02 DIAGNOSIS — M9905 Segmental and somatic dysfunction of pelvic region: Secondary | ICD-10-CM | POA: Diagnosis not present

## 2022-01-02 DIAGNOSIS — M9903 Segmental and somatic dysfunction of lumbar region: Secondary | ICD-10-CM | POA: Diagnosis not present

## 2022-01-02 DIAGNOSIS — E282 Polycystic ovarian syndrome: Secondary | ICD-10-CM | POA: Diagnosis not present

## 2022-01-07 DIAGNOSIS — M9903 Segmental and somatic dysfunction of lumbar region: Secondary | ICD-10-CM | POA: Diagnosis not present

## 2022-01-07 DIAGNOSIS — M9905 Segmental and somatic dysfunction of pelvic region: Secondary | ICD-10-CM | POA: Diagnosis not present

## 2022-01-07 DIAGNOSIS — M9901 Segmental and somatic dysfunction of cervical region: Secondary | ICD-10-CM | POA: Diagnosis not present

## 2022-01-07 DIAGNOSIS — M9904 Segmental and somatic dysfunction of sacral region: Secondary | ICD-10-CM | POA: Diagnosis not present

## 2022-01-09 DIAGNOSIS — M9904 Segmental and somatic dysfunction of sacral region: Secondary | ICD-10-CM | POA: Diagnosis not present

## 2022-01-09 DIAGNOSIS — M9901 Segmental and somatic dysfunction of cervical region: Secondary | ICD-10-CM | POA: Diagnosis not present

## 2022-01-09 DIAGNOSIS — Z0000 Encounter for general adult medical examination without abnormal findings: Secondary | ICD-10-CM | POA: Diagnosis not present

## 2022-01-09 DIAGNOSIS — M9903 Segmental and somatic dysfunction of lumbar region: Secondary | ICD-10-CM | POA: Diagnosis not present

## 2022-01-09 DIAGNOSIS — Z23 Encounter for immunization: Secondary | ICD-10-CM | POA: Diagnosis not present

## 2022-01-09 DIAGNOSIS — M9905 Segmental and somatic dysfunction of pelvic region: Secondary | ICD-10-CM | POA: Diagnosis not present

## 2022-01-10 DIAGNOSIS — G4733 Obstructive sleep apnea (adult) (pediatric): Secondary | ICD-10-CM | POA: Diagnosis not present

## 2022-01-14 DIAGNOSIS — M9901 Segmental and somatic dysfunction of cervical region: Secondary | ICD-10-CM | POA: Diagnosis not present

## 2022-01-14 DIAGNOSIS — M9903 Segmental and somatic dysfunction of lumbar region: Secondary | ICD-10-CM | POA: Diagnosis not present

## 2022-01-14 DIAGNOSIS — M9905 Segmental and somatic dysfunction of pelvic region: Secondary | ICD-10-CM | POA: Diagnosis not present

## 2022-01-14 DIAGNOSIS — M9904 Segmental and somatic dysfunction of sacral region: Secondary | ICD-10-CM | POA: Diagnosis not present

## 2022-01-16 DIAGNOSIS — M9903 Segmental and somatic dysfunction of lumbar region: Secondary | ICD-10-CM | POA: Diagnosis not present

## 2022-01-16 DIAGNOSIS — M9904 Segmental and somatic dysfunction of sacral region: Secondary | ICD-10-CM | POA: Diagnosis not present

## 2022-01-16 DIAGNOSIS — M9905 Segmental and somatic dysfunction of pelvic region: Secondary | ICD-10-CM | POA: Diagnosis not present

## 2022-01-16 DIAGNOSIS — M9901 Segmental and somatic dysfunction of cervical region: Secondary | ICD-10-CM | POA: Diagnosis not present

## 2022-01-18 DIAGNOSIS — G4733 Obstructive sleep apnea (adult) (pediatric): Secondary | ICD-10-CM | POA: Diagnosis not present

## 2022-01-21 DIAGNOSIS — M9901 Segmental and somatic dysfunction of cervical region: Secondary | ICD-10-CM | POA: Diagnosis not present

## 2022-01-21 DIAGNOSIS — M9904 Segmental and somatic dysfunction of sacral region: Secondary | ICD-10-CM | POA: Diagnosis not present

## 2022-01-21 DIAGNOSIS — M9903 Segmental and somatic dysfunction of lumbar region: Secondary | ICD-10-CM | POA: Diagnosis not present

## 2022-01-21 DIAGNOSIS — M9905 Segmental and somatic dysfunction of pelvic region: Secondary | ICD-10-CM | POA: Diagnosis not present

## 2022-01-23 DIAGNOSIS — M9901 Segmental and somatic dysfunction of cervical region: Secondary | ICD-10-CM | POA: Diagnosis not present

## 2022-01-23 DIAGNOSIS — M9905 Segmental and somatic dysfunction of pelvic region: Secondary | ICD-10-CM | POA: Diagnosis not present

## 2022-01-23 DIAGNOSIS — M9903 Segmental and somatic dysfunction of lumbar region: Secondary | ICD-10-CM | POA: Diagnosis not present

## 2022-01-23 DIAGNOSIS — M9904 Segmental and somatic dysfunction of sacral region: Secondary | ICD-10-CM | POA: Diagnosis not present

## 2022-01-28 DIAGNOSIS — M9903 Segmental and somatic dysfunction of lumbar region: Secondary | ICD-10-CM | POA: Diagnosis not present

## 2022-01-28 DIAGNOSIS — M9904 Segmental and somatic dysfunction of sacral region: Secondary | ICD-10-CM | POA: Diagnosis not present

## 2022-01-28 DIAGNOSIS — M9901 Segmental and somatic dysfunction of cervical region: Secondary | ICD-10-CM | POA: Diagnosis not present

## 2022-01-28 DIAGNOSIS — M9905 Segmental and somatic dysfunction of pelvic region: Secondary | ICD-10-CM | POA: Diagnosis not present

## 2022-01-29 DIAGNOSIS — J029 Acute pharyngitis, unspecified: Secondary | ICD-10-CM | POA: Diagnosis not present

## 2022-01-29 DIAGNOSIS — R632 Polyphagia: Secondary | ICD-10-CM | POA: Diagnosis not present

## 2022-01-29 DIAGNOSIS — E282 Polycystic ovarian syndrome: Secondary | ICD-10-CM | POA: Diagnosis not present

## 2022-01-29 DIAGNOSIS — R5383 Other fatigue: Secondary | ICD-10-CM | POA: Diagnosis not present

## 2022-01-29 DIAGNOSIS — Z6833 Body mass index (BMI) 33.0-33.9, adult: Secondary | ICD-10-CM | POA: Diagnosis not present

## 2022-01-29 DIAGNOSIS — R051 Acute cough: Secondary | ICD-10-CM | POA: Diagnosis not present

## 2022-01-30 DIAGNOSIS — M9904 Segmental and somatic dysfunction of sacral region: Secondary | ICD-10-CM | POA: Diagnosis not present

## 2022-01-30 DIAGNOSIS — M9901 Segmental and somatic dysfunction of cervical region: Secondary | ICD-10-CM | POA: Diagnosis not present

## 2022-01-30 DIAGNOSIS — M9905 Segmental and somatic dysfunction of pelvic region: Secondary | ICD-10-CM | POA: Diagnosis not present

## 2022-01-30 DIAGNOSIS — M9903 Segmental and somatic dysfunction of lumbar region: Secondary | ICD-10-CM | POA: Diagnosis not present

## 2022-02-03 DIAGNOSIS — U071 COVID-19: Secondary | ICD-10-CM | POA: Diagnosis not present

## 2022-02-10 DIAGNOSIS — G4733 Obstructive sleep apnea (adult) (pediatric): Secondary | ICD-10-CM | POA: Diagnosis not present

## 2022-02-11 DIAGNOSIS — M9901 Segmental and somatic dysfunction of cervical region: Secondary | ICD-10-CM | POA: Diagnosis not present

## 2022-02-11 DIAGNOSIS — M9905 Segmental and somatic dysfunction of pelvic region: Secondary | ICD-10-CM | POA: Diagnosis not present

## 2022-02-11 DIAGNOSIS — M9903 Segmental and somatic dysfunction of lumbar region: Secondary | ICD-10-CM | POA: Diagnosis not present

## 2022-02-11 DIAGNOSIS — M9904 Segmental and somatic dysfunction of sacral region: Secondary | ICD-10-CM | POA: Diagnosis not present

## 2022-02-13 DIAGNOSIS — M9901 Segmental and somatic dysfunction of cervical region: Secondary | ICD-10-CM | POA: Diagnosis not present

## 2022-02-13 DIAGNOSIS — M9904 Segmental and somatic dysfunction of sacral region: Secondary | ICD-10-CM | POA: Diagnosis not present

## 2022-02-13 DIAGNOSIS — M9903 Segmental and somatic dysfunction of lumbar region: Secondary | ICD-10-CM | POA: Diagnosis not present

## 2022-02-13 DIAGNOSIS — M9905 Segmental and somatic dysfunction of pelvic region: Secondary | ICD-10-CM | POA: Diagnosis not present

## 2022-02-18 DIAGNOSIS — M9903 Segmental and somatic dysfunction of lumbar region: Secondary | ICD-10-CM | POA: Diagnosis not present

## 2022-02-18 DIAGNOSIS — M9905 Segmental and somatic dysfunction of pelvic region: Secondary | ICD-10-CM | POA: Diagnosis not present

## 2022-02-18 DIAGNOSIS — M9901 Segmental and somatic dysfunction of cervical region: Secondary | ICD-10-CM | POA: Diagnosis not present

## 2022-02-18 DIAGNOSIS — M9904 Segmental and somatic dysfunction of sacral region: Secondary | ICD-10-CM | POA: Diagnosis not present

## 2022-02-20 DIAGNOSIS — M9905 Segmental and somatic dysfunction of pelvic region: Secondary | ICD-10-CM | POA: Diagnosis not present

## 2022-02-20 DIAGNOSIS — M9904 Segmental and somatic dysfunction of sacral region: Secondary | ICD-10-CM | POA: Diagnosis not present

## 2022-02-20 DIAGNOSIS — M9903 Segmental and somatic dysfunction of lumbar region: Secondary | ICD-10-CM | POA: Diagnosis not present

## 2022-02-20 DIAGNOSIS — M9901 Segmental and somatic dysfunction of cervical region: Secondary | ICD-10-CM | POA: Diagnosis not present

## 2022-02-25 DIAGNOSIS — M9905 Segmental and somatic dysfunction of pelvic region: Secondary | ICD-10-CM | POA: Diagnosis not present

## 2022-02-25 DIAGNOSIS — M9904 Segmental and somatic dysfunction of sacral region: Secondary | ICD-10-CM | POA: Diagnosis not present

## 2022-02-25 DIAGNOSIS — M9903 Segmental and somatic dysfunction of lumbar region: Secondary | ICD-10-CM | POA: Diagnosis not present

## 2022-02-25 DIAGNOSIS — M9901 Segmental and somatic dysfunction of cervical region: Secondary | ICD-10-CM | POA: Diagnosis not present

## 2022-02-27 DIAGNOSIS — M9905 Segmental and somatic dysfunction of pelvic region: Secondary | ICD-10-CM | POA: Diagnosis not present

## 2022-02-27 DIAGNOSIS — M9901 Segmental and somatic dysfunction of cervical region: Secondary | ICD-10-CM | POA: Diagnosis not present

## 2022-02-27 DIAGNOSIS — M9903 Segmental and somatic dysfunction of lumbar region: Secondary | ICD-10-CM | POA: Diagnosis not present

## 2022-02-27 DIAGNOSIS — M9904 Segmental and somatic dysfunction of sacral region: Secondary | ICD-10-CM | POA: Diagnosis not present

## 2022-02-28 DIAGNOSIS — E282 Polycystic ovarian syndrome: Secondary | ICD-10-CM | POA: Diagnosis not present

## 2022-02-28 DIAGNOSIS — R632 Polyphagia: Secondary | ICD-10-CM | POA: Diagnosis not present

## 2022-03-12 DIAGNOSIS — G4733 Obstructive sleep apnea (adult) (pediatric): Secondary | ICD-10-CM | POA: Diagnosis not present

## 2022-03-13 DIAGNOSIS — M9901 Segmental and somatic dysfunction of cervical region: Secondary | ICD-10-CM | POA: Diagnosis not present

## 2022-03-13 DIAGNOSIS — M9904 Segmental and somatic dysfunction of sacral region: Secondary | ICD-10-CM | POA: Diagnosis not present

## 2022-03-13 DIAGNOSIS — M9905 Segmental and somatic dysfunction of pelvic region: Secondary | ICD-10-CM | POA: Diagnosis not present

## 2022-03-13 DIAGNOSIS — M9903 Segmental and somatic dysfunction of lumbar region: Secondary | ICD-10-CM | POA: Diagnosis not present

## 2022-03-20 DIAGNOSIS — M9905 Segmental and somatic dysfunction of pelvic region: Secondary | ICD-10-CM | POA: Diagnosis not present

## 2022-03-20 DIAGNOSIS — M9904 Segmental and somatic dysfunction of sacral region: Secondary | ICD-10-CM | POA: Diagnosis not present

## 2022-03-20 DIAGNOSIS — M9903 Segmental and somatic dysfunction of lumbar region: Secondary | ICD-10-CM | POA: Diagnosis not present

## 2022-03-20 DIAGNOSIS — M9901 Segmental and somatic dysfunction of cervical region: Secondary | ICD-10-CM | POA: Diagnosis not present

## 2022-10-30 ENCOUNTER — Other Ambulatory Visit: Payer: Self-pay | Admitting: Family

## 2022-10-30 DIAGNOSIS — N6459 Other signs and symptoms in breast: Secondary | ICD-10-CM | POA: Diagnosis not present

## 2022-10-30 DIAGNOSIS — N63 Unspecified lump in unspecified breast: Secondary | ICD-10-CM

## 2022-10-30 DIAGNOSIS — N6320 Unspecified lump in the left breast, unspecified quadrant: Secondary | ICD-10-CM | POA: Diagnosis not present

## 2022-11-05 ENCOUNTER — Ambulatory Visit: Payer: BC Managed Care – PPO

## 2022-11-05 ENCOUNTER — Ambulatory Visit
Admission: RE | Admit: 2022-11-05 | Discharge: 2022-11-05 | Disposition: A | Discharge status: Home / Self Care | Payer: BC Managed Care – PPO | Source: Ambulatory Visit | Attending: Family | Admitting: Family

## 2022-11-05 DIAGNOSIS — N63 Unspecified lump in unspecified breast: Secondary | ICD-10-CM

## 2022-11-05 DIAGNOSIS — R234 Changes in skin texture: Secondary | ICD-10-CM | POA: Diagnosis not present

## 2022-11-05 DIAGNOSIS — N6459 Other signs and symptoms in breast: Secondary | ICD-10-CM | POA: Diagnosis not present

## 2022-11-11 ENCOUNTER — Other Ambulatory Visit: Payer: BC Managed Care – PPO

## 2022-11-28 DIAGNOSIS — Z9189 Other specified personal risk factors, not elsewhere classified: Secondary | ICD-10-CM | POA: Diagnosis not present

## 2022-11-28 DIAGNOSIS — E785 Hyperlipidemia, unspecified: Secondary | ICD-10-CM | POA: Diagnosis not present

## 2022-11-28 DIAGNOSIS — R7303 Prediabetes: Secondary | ICD-10-CM | POA: Diagnosis not present

## 2022-12-12 DIAGNOSIS — Z01419 Encounter for gynecological examination (general) (routine) without abnormal findings: Secondary | ICD-10-CM | POA: Diagnosis not present

## 2022-12-12 DIAGNOSIS — Z6835 Body mass index (BMI) 35.0-35.9, adult: Secondary | ICD-10-CM | POA: Diagnosis not present

## 2022-12-12 DIAGNOSIS — Z124 Encounter for screening for malignant neoplasm of cervix: Secondary | ICD-10-CM | POA: Diagnosis not present

## 2022-12-18 DIAGNOSIS — E785 Hyperlipidemia, unspecified: Secondary | ICD-10-CM | POA: Diagnosis not present

## 2022-12-18 DIAGNOSIS — E282 Polycystic ovarian syndrome: Secondary | ICD-10-CM | POA: Diagnosis not present

## 2022-12-18 DIAGNOSIS — Z9189 Other specified personal risk factors, not elsewhere classified: Secondary | ICD-10-CM | POA: Diagnosis not present

## 2022-12-26 ENCOUNTER — Encounter: Payer: Self-pay | Admitting: Dermatology

## 2022-12-26 ENCOUNTER — Ambulatory Visit: Payer: BC Managed Care – PPO | Admitting: Dermatology

## 2022-12-26 VITALS — BP 114/74 | HR 85

## 2022-12-26 DIAGNOSIS — D2362 Other benign neoplasm of skin of left upper limb, including shoulder: Secondary | ICD-10-CM

## 2022-12-26 DIAGNOSIS — Z1283 Encounter for screening for malignant neoplasm of skin: Secondary | ICD-10-CM

## 2022-12-26 DIAGNOSIS — L308 Other specified dermatitis: Secondary | ICD-10-CM | POA: Diagnosis not present

## 2022-12-26 DIAGNOSIS — D229 Melanocytic nevi, unspecified: Secondary | ICD-10-CM

## 2022-12-26 DIAGNOSIS — L814 Other melanin hyperpigmentation: Secondary | ICD-10-CM

## 2022-12-26 DIAGNOSIS — D2371 Other benign neoplasm of skin of right lower limb, including hip: Secondary | ICD-10-CM

## 2022-12-26 DIAGNOSIS — L80 Vitiligo: Secondary | ICD-10-CM

## 2022-12-26 DIAGNOSIS — L821 Other seborrheic keratosis: Secondary | ICD-10-CM

## 2022-12-26 DIAGNOSIS — R21 Rash and other nonspecific skin eruption: Secondary | ICD-10-CM | POA: Diagnosis not present

## 2022-12-26 DIAGNOSIS — L309 Dermatitis, unspecified: Secondary | ICD-10-CM | POA: Diagnosis not present

## 2022-12-26 DIAGNOSIS — D239 Other benign neoplasm of skin, unspecified: Secondary | ICD-10-CM

## 2022-12-26 DIAGNOSIS — L304 Erythema intertrigo: Secondary | ICD-10-CM

## 2022-12-26 NOTE — Progress Notes (Signed)
New Patient Visit   Subjective  Pamela Oconnor is a 37 y.o. female who presents for the following: Skin Cancer Screening and Full Body Skin Exam  The patient presents for Total-Body Skin Exam (TBSE) for skin cancer screening and mole check. The patient has spots, moles and lesions to be evaluated, some may be new or changing. Pt has no hx of skin cancer and no family hx.  Patient is accompanied by her husband.  Patient mentions a rash on her left nipple, present for about a year.  Rash has been itchy at times, and scaly.  She has never had a biopsy of the area nor has she treated with topicals other than Vaseline.  She did go see a breast imaging center and had a mammography of the left breast, which did not show any underlying malignancy.  She is concerned about potential Paget's disease.  She has never had a similar rash on her right breast.  She also has several lesions of concern on her lower legs, present for several years, not changing or growing, not previously treated.  The following portions of the chart were reviewed this encounter and updated as appropriate: medications, allergies, medical history  Review of Systems:  No other skin or systemic complaints except as noted in HPI or Assessment and Plan.  Objective  Well appearing patient in no apparent distress; mood and affect are within normal limits.  A full examination was performed including scalp, head, eyes, ears, nose, lips, neck, chest, axillae, abdomen, back, buttocks, bilateral upper extremities, bilateral lower extremities, hands, feet, fingers, toes, fingernails, and toenails. All findings within normal limits unless otherwise noted below.   Relevant physical exam findings are noted in the Assessment and Plan.  Left Breast        Assessment & Plan   SKIN CANCER SCREENING PERFORMED TODAY.  MELANOCYTIC NEVI - Tan-brown and/or pink-flesh-colored symmetric macules and papules - Benign appearing on exam  today - Observation - Call clinic for new or changing moles - Recommend Oconnor use of broad spectrum spf 30+ sunscreen to sun-exposed areas.   LENTIGINES Exam: scattered tan macules Due to sun exposure Treatment Plan: Benign-appearing, observe. Recommend Oconnor broad spectrum sunscreen SPF 30+ to sun-exposed areas, reapply every 2 hours as needed.  Call for any changes   SEBORRHEIC KERATOSIS - Stuck-on, waxy, tan-brown papules and/or plaques  - Benign-appearing - Discussed benign etiology and prognosis. - Observe - Call for any changes   Intertrigo (Rash at folds)- inframammary folds- acute condition, not at treatment goal - Recommend antifungal powder such as Zeasorb AF Oconnor for prevention - Will consider prescription meds at next visit if not improved   DERMATOFIBROMA left arm and right leg Exam: Firm pink/brown papulenodule with dimple sign. Treatment Plan: A dermatofibroma is a benign growth possibly related to trauma, such as an insect bite, cut from shaving, or inflamed acne-type bump.  Treatment options to remove include shave or excision with resulting scar and risk of recurrence.  Since benign-appearing and not bothersome, will observe for now.    VITILIGO- acute condition, not at treatment goal Exam: depigmented patches on left medial thigh  Vitiligo is a chronic autoimmune condition which causes loss of skin pigment and is commonly seen on the face and may also involve areas of trauma like hands, elbows, knees, and ankles. There is no cure and it is difficult to treat.  Treatments include topical steroids and other topical anti-inflammatory ointments/creams and topical and oral Jak inhibitors.  Sometimes  narrow band UV light therapy or Xtrac laser is helpful, both of which require twice weekly treatments for at least 3-6 months.  Antioxidant vitamins, such as Vitamins A,C,E,D, Folic Acid and B12 may be added to enhance treatment. Heliocare may also enhance treatment  results.  Treatment Plan: -Discussed option of topical steroid and nonsteroidals alternating weekly and weekends for 2 months, patient prefers to wait at this time and not treat  Rash and other nonspecific skin eruption-eczematous plaque on the left breast, superior areola Left Breast  -Discussed the potential diagnoses including eczema versus contact dermatitis versus less likely Paget's disease versus other -Discussed option of trying topical medium potency steroids twice Oconnor as needed for 1 to 2 months to see if lesion improves versus biopsy - Patient expressed anxiety about rash and would prefer to have biopsy done today -Punch biopsy done today, see procedure information below Skin / nail biopsy - Left Breast Type of biopsy: punch   Punch size:  4 mm Suture size:  5-0 Suture type: fast-absorbing plain gut    Specimen 1 - Surgical pathology Differential Diagnosis: Nipple eczema vs contact dermatitis vs less likely paget's vs other  Check Margins: No  Return in about 3 months (around 03/28/2023).  I, Pamela Oconnor, CMA, am acting as scribe for Pamela Daily, MD.   I personally spent about 45 minutes discussing diagnoses, and reviewing past medical records as well as doing procedures.  Documentation: I have reviewed the above documentation for accuracy and completeness, and I agree with the above.  Pamela Daily, MD

## 2022-12-26 NOTE — Patient Instructions (Addendum)
Patient Handout: Wound Care for Skin Biopsy Site  Taking Care of Your Skin Biopsy Site  Proper care of the biopsy site is essential for promoting healing and minimizing scarring. This handout provides instructions on how to care for your biopsy site to ensure optimal recovery.  1. Cleaning the Wound:  Clean the biopsy site daily with gentle soap and water. Gently pat the area dry with a clean, soft towel. Avoid harsh scrubbing or rubbing the area, as this can irritate the skin and delay healing.  2. Applying Aquaphor and Bandage:  After cleaning the wound, apply a thin layer of Aquaphor ointment to the biopsy site. Cover the area with a sterile bandage to protect it from dirt, bacteria, and friction. Change the bandage daily or as needed if it becomes soiled or wet.  3. Continued Care for One Week:  Repeat the cleaning, Aquaphor application, and bandaging process daily for one week following the biopsy procedure. Keeping the wound clean and moist during this initial healing period will help prevent infection and promote optimal healing.  4. Massaging Aquaphor into the Area:  ---After one week, discontinue the use of bandages but continue to apply Aquaphor to the biopsy site. ----Gently massage the Aquaphor into the area using circular motions. ---Massaging the skin helps to promote circulation and prevent the formation of scar tissue.   Additional Tips:  Avoid exposing the biopsy site to direct sunlight during the healing process, as this can cause hyperpigmentation or worsen scarring. If you experience any signs of infection, such as increased redness, swelling, warmth, or drainage from the wound, contact your healthcare provider immediately. Follow any additional instructions provided by your healthcare provider for caring for the biopsy site and managing any discomfort. Conclusion:  Taking proper care of your skin biopsy site is crucial for ensuring optimal healing and  minimizing scarring. By following these instructions for cleaning, applying Aquaphor, and massaging the area, you can promote a smooth and successful recovery. If you have any questions or concerns about caring for your biopsy site, don't hesitate to contact your healthcare provider for guidance.    Skin Education : We  counseled the patient regarding the following: Sun screen (SPF 30 or greater) should be applied during peak UV exposure (between 10am and 2pm) and reapplied after exercise or swimming.  The ABCDEs of melanoma were reviewed with the patient, and the importance of monthly self-examination of moles was emphasized. Should any moles change in shape or color, or itch, bleed or burn, pt will contact our office for evaluation sooner then their interval appointment.  Plan: Sunscreen Recommendations We recommended a broad spectrum sunscreen with a SPF of 30 or higher.  SPF 30 sunscreens block approximately 97 percent of the sun's harmful rays. Sunscreens should be applied at least 15 minutes prior to expected sun exposure and then every 2 hours after that as long as sun exposure continues. If swimming or exercising sunscreen should be reapplied every 45 minutes to an hour after getting wet or sweating. One ounce, or the equivalent of a shot glass full of sunscreen, is adequate to protect the skin not covered by a bathing suit. We also recommended a lip balm with a sunscreen as well. Sun protective clothing can be used in lieu of sunscreen but must be worn the entire time you are exposed to the sun's rays. Important Information   Due to recent changes in healthcare laws, you may see results of your pathology and/or laboratory studies on MyChart before  the doctors have had a chance to review them. We understand that in some cases there may be results that are confusing or concerning to you. Please understand that not all results are received at the same time and often the doctors may need to interpret  multiple results in order to provide you with the best plan of care or course of treatment. Therefore, we ask that you please give [place] 2 business days to thoroughly review all your results before contacting the office for clarification. Should we see a critical lab result, you will be contacted sooner.     If You Need Anything After Your Visit   If you have any questions or concerns for your doctor, please call our main line at [phone]. If no one answers, please leave a voicemail as directed and we will return your call as soon as possible. Messages left after 4 pm will be answered the following business day.    You may also send [place] a message via MyChart. We typically respond to MyChart messages within 1-2 business days.  For prescription refills, please ask your pharmacy to contact our office. Our fax number is [phone].  If you have an urgent issue when the clinic is closed that cannot wait until the next business day, you can page your doctor at the number below.     Please note that while we do our best to be available for urgent issues outside of office hours, we are not available 24/7.    If you have an urgent issue and are unable to reach [place], you may choose to seek medical care at your doctor's office, retail clinic, urgent care center, or emergency room.   If you have a medical emergency, please immediately call 911 or go to the emergency department. In the event of inclement weather, please call our main line at [phone] for an update on the status of any delays or closures.  Dermatology Medication Tips: Please keep the boxes that topical medications come in in order to help keep track of the instructions about where and how to use these. Pharmacies typically print the medication instructions only on the boxes and not directly on the medication tubes.   If your medication is too expensive, please contact our office at [phone] or send [place] a message through MyChart.     We are unable to tell what your co-pay for medications will be in advance as this is different depending on your insurance coverage. However, we may be able to find a substitute medication at lower cost or fill out paperwork to get insurance to cover a needed medication.    If a prior authorization is required to get your medication covered by your insurance company, please allow [place] 1-2 business days to complete this process.   Drug prices often vary depending on where the prescription is filled and some pharmacies may offer cheaper prices.   The website www.goodrx.com contains coupons for medications through different pharmacies. The prices here do not account for what the cost may be with help from insurance (it may be cheaper with your insurance), but the website can give you the price if you did not use any insurance.  - You can print the associated coupon and take it with your prescription to the pharmacy.  - You may also stop by our office during regular business hours and pick up a GoodRx coupon card.  - If you need your prescription sent electronically to a different pharmacy, notify  our office through [facility] or by phone at [phone]

## 2022-12-31 LAB — SURGICAL PATHOLOGY

## 2023-01-02 ENCOUNTER — Other Ambulatory Visit: Payer: Self-pay | Admitting: Dermatology

## 2023-01-02 ENCOUNTER — Telehealth: Payer: Self-pay

## 2023-01-02 DIAGNOSIS — L309 Dermatitis, unspecified: Secondary | ICD-10-CM

## 2023-01-02 MED ORDER — TRIAMCINOLONE ACETONIDE 0.1 % EX OINT: 1.0000 | TOPICAL_OINTMENT | Freq: Two times a day (BID) | CUTANEOUS | 0 refills | Status: DC

## 2023-01-02 NOTE — Telephone Encounter (Signed)
Patient called to say that she suture has come out of her biopsy site. Advised her that the suture was dissolvable and the site will continue to heal with time. Advised her to keep it covered with vaseline and a bandaid until healed/hd

## 2023-01-09 DIAGNOSIS — E785 Hyperlipidemia, unspecified: Secondary | ICD-10-CM | POA: Diagnosis not present

## 2023-01-09 DIAGNOSIS — E669 Obesity, unspecified: Secondary | ICD-10-CM | POA: Diagnosis not present

## 2023-01-09 DIAGNOSIS — Z6834 Body mass index (BMI) 34.0-34.9, adult: Secondary | ICD-10-CM | POA: Diagnosis not present

## 2023-01-09 DIAGNOSIS — E559 Vitamin D deficiency, unspecified: Secondary | ICD-10-CM | POA: Diagnosis not present

## 2023-01-09 DIAGNOSIS — Z79899 Other long term (current) drug therapy: Secondary | ICD-10-CM | POA: Diagnosis not present

## 2023-01-09 DIAGNOSIS — E282 Polycystic ovarian syndrome: Secondary | ICD-10-CM | POA: Diagnosis not present

## 2023-01-16 DIAGNOSIS — Z0000 Encounter for general adult medical examination without abnormal findings: Secondary | ICD-10-CM | POA: Diagnosis not present

## 2023-01-21 DIAGNOSIS — Z713 Dietary counseling and surveillance: Secondary | ICD-10-CM | POA: Diagnosis not present

## 2023-02-11 DIAGNOSIS — R7303 Prediabetes: Secondary | ICD-10-CM | POA: Diagnosis not present

## 2023-02-11 DIAGNOSIS — Z6833 Body mass index (BMI) 33.0-33.9, adult: Secondary | ICD-10-CM | POA: Diagnosis not present

## 2023-02-11 DIAGNOSIS — E66811 Obesity, class 1: Secondary | ICD-10-CM | POA: Diagnosis not present

## 2023-02-11 DIAGNOSIS — Z79899 Other long term (current) drug therapy: Secondary | ICD-10-CM | POA: Diagnosis not present

## 2023-02-20 ENCOUNTER — Ambulatory Visit: Payer: BC Managed Care – PPO | Admitting: Dermatology

## 2023-03-05 ENCOUNTER — Ambulatory Visit: Payer: BC Managed Care – PPO | Admitting: Dermatology

## 2023-03-05 ENCOUNTER — Encounter: Payer: Self-pay | Admitting: Dermatology

## 2023-03-05 DIAGNOSIS — L259 Unspecified contact dermatitis, unspecified cause: Secondary | ICD-10-CM

## 2023-03-05 DIAGNOSIS — R21 Rash and other nonspecific skin eruption: Secondary | ICD-10-CM

## 2023-03-05 NOTE — Patient Instructions (Signed)

## 2023-03-05 NOTE — Progress Notes (Signed)
   Follow-Up Visit   Subjective  Pamela Oconnor is a 37 y.o. female who presents for the following: Follow up for a rash, she was seen on 12/26/2022 for a rash on her left nipple, bxed showing spongiotic dermatitis. She was given TAC ointment and she has been using the ointment and Aquaphor and the rash has cleared.  The patient has spots, moles and lesions to be evaluated, some may be new or changing and the patient may have concern these could be cancer.   The following portions of the chart were reviewed this encounter and updated as appropriate: medications, allergies, medical history  Review of Systems:  No other skin or systemic complaints except as noted in HPI or Assessment and Plan.  Objective  Well appearing patient in no apparent distress; mood and affect are within normal limits.   A focused examination was performed of the following areas:  Left breast  Relevant exam findings are noted in the Assessment and Plan.  Left Breast Clear skin today on exam    Assessment & Plan   CONTACT DERMATITIS Exam: scaly pink papules and/or plaques +/- vesiculation  Wellcontrolled  Treatment Plan: - Continue TAC ointment as needed - Use emollients like aquaphor  Return if symptoms worsen or fail to improve.  Dominga Ferry, Surg Tech III, am acting as scribe for [name], MD.   Documentation: I have reviewed the above documentation for accuracy and completeness, and I agree with the above.  [name], MD

## 2023-04-30 DIAGNOSIS — Z6832 Body mass index (BMI) 32.0-32.9, adult: Secondary | ICD-10-CM | POA: Diagnosis not present

## 2023-04-30 DIAGNOSIS — E282 Polycystic ovarian syndrome: Secondary | ICD-10-CM | POA: Diagnosis not present

## 2023-04-30 DIAGNOSIS — R7303 Prediabetes: Secondary | ICD-10-CM | POA: Diagnosis not present

## 2023-04-30 DIAGNOSIS — E66811 Obesity, class 1: Secondary | ICD-10-CM | POA: Diagnosis not present

## 2023-04-30 DIAGNOSIS — E88819 Insulin resistance, unspecified: Secondary | ICD-10-CM | POA: Diagnosis not present

## 2023-04-30 DIAGNOSIS — Z79899 Other long term (current) drug therapy: Secondary | ICD-10-CM | POA: Diagnosis not present

## 2023-05-20 DIAGNOSIS — R7303 Prediabetes: Secondary | ICD-10-CM | POA: Diagnosis not present

## 2023-05-20 DIAGNOSIS — Z9189 Other specified personal risk factors, not elsewhere classified: Secondary | ICD-10-CM | POA: Diagnosis not present

## 2023-07-29 DIAGNOSIS — E88819 Insulin resistance, unspecified: Secondary | ICD-10-CM | POA: Diagnosis not present

## 2023-07-29 DIAGNOSIS — Z9189 Other specified personal risk factors, not elsewhere classified: Secondary | ICD-10-CM | POA: Diagnosis not present

## 2023-08-20 ENCOUNTER — Emergency Department (HOSPITAL_BASED_OUTPATIENT_CLINIC_OR_DEPARTMENT_OTHER)

## 2023-08-20 ENCOUNTER — Encounter (HOSPITAL_BASED_OUTPATIENT_CLINIC_OR_DEPARTMENT_OTHER): Payer: Self-pay | Admitting: Emergency Medicine

## 2023-08-20 ENCOUNTER — Emergency Department (HOSPITAL_BASED_OUTPATIENT_CLINIC_OR_DEPARTMENT_OTHER)
Admission: EM | Admit: 2023-08-20 | Discharge: 2023-08-20 | Disposition: A | Discharge status: Home / Self Care | Attending: Emergency Medicine | Admitting: Emergency Medicine

## 2023-08-20 ENCOUNTER — Other Ambulatory Visit: Payer: Self-pay

## 2023-08-20 DIAGNOSIS — M79604 Pain in right leg: Secondary | ICD-10-CM | POA: Diagnosis not present

## 2023-08-20 DIAGNOSIS — Z7901 Long term (current) use of anticoagulants: Secondary | ICD-10-CM | POA: Diagnosis not present

## 2023-08-20 DIAGNOSIS — M79661 Pain in right lower leg: Secondary | ICD-10-CM | POA: Insufficient documentation

## 2023-08-20 DIAGNOSIS — R2241 Localized swelling, mass and lump, right lower limb: Secondary | ICD-10-CM | POA: Diagnosis not present

## 2023-08-20 DIAGNOSIS — M7989 Other specified soft tissue disorders: Secondary | ICD-10-CM | POA: Diagnosis not present

## 2023-08-20 LAB — BASIC METABOLIC PANEL WITH GFR
Anion gap: 11 (ref 5–15)
BUN: 12 mg/dL (ref 6–20)
CO2: 25 mmol/L (ref 22–32)
Calcium: 10 mg/dL (ref 8.9–10.3)
Chloride: 103 mmol/L (ref 98–111)
Creatinine, Ser: 0.83 mg/dL (ref 0.44–1.00)
GFR, Estimated: >60 mL/min (ref >60)
Glucose, Bld: 86 mg/dL (ref 70–99)
Potassium: 4.1 mmol/L (ref 3.5–5.1)
Sodium: 140 mmol/L (ref 135–145)

## 2023-08-20 LAB — CBC
HCT: 40.2 % (ref 36.0–46.0)
Hemoglobin: 13.2 g/dL (ref 12.0–15.0)
MCH: 28.3 pg (ref 26.0–34.0)
MCHC: 32.8 g/dL (ref 30.0–36.0)
MCV: 86.1 fL (ref 80.0–100.0)
Platelets: 314 10*3/uL (ref 150–400)
RBC: 4.67 MIL/uL (ref 3.87–5.11)
RDW: 12 % (ref 11.5–15.5)
WBC: 4.8 10*3/uL (ref 4.0–10.5)
nRBC: 0 % (ref 0.0–0.2)

## 2023-08-20 LAB — PREGNANCY, URINE: Preg Test, Ur: NEGATIVE

## 2023-08-20 NOTE — Discharge Instructions (Signed)
 You were seen for pain and swelling of your leg in the emergency department.   At home, please use Tylenol as needed for your pain.  Use compression stockings for the swelling of your leg.    Check your MyChart online for the results of any tests that had not resulted by the time you left the emergency department.   Follow-up with your primary doctor in 2-3 days regarding your visit.    Return immediately to the emergency department if you experience any of the following: Worsening pain, swelling, chest pain, shortness of breath, or any other concerning symptoms.    Thank you for visiting our Emergency Department. It was a pleasure taking care of you today.

## 2023-08-20 NOTE — ED Provider Notes (Signed)
 Pamela Oconnor EMERGENCY DEPARTMENT AT [facility] Provider Note   CSN: [id number] Arrival date & time: 08/20/23  1843     History  Chief Complaint  Patient presents with   Leg Pain    Pamela Oconnor is a 38 y.o. female.  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  Patient reports that she went to [place]  recently and upon returning started having right leg pain.  Says that for the past 5 days has noticed some swelling and pain of her right lower extremity.  Goes from her mid thigh down to the back of her calf.  No injuries.  No exacerbating or alleviating factors.  Does have a history of PE and is worried about a blood clot.  Says that she was on fertility medication recently as well.  Was previously on Eliquis but has been off of it for a while now.  Not currently having any chest pain or shortness of breath.         Home Medications Prior to Admission medications   Medication Sig Start Date End Date Taking? Authorizing Provider  triamcinolone  ointment (KENALOG ) 0.1 % Apply 1 Application topically 2 (two) times daily. To affected area 01/02/23   Paci, Karina M, MD      Allergies    Patient has no known allergies.    Review of Systems   Review of Systems  Physical Exam Updated Vital Signs BP 124/83   Pulse 74   Temp 97.9 F (36.6 C)   Resp 18   Ht 5\' 11"  (1.803 m)   Wt 108.4 kg   LMP 08/05/2023 (Exact Date)   SpO2 100%   BMI 33.33 kg/m  Physical Exam Vitals and nursing note reviewed.  Constitutional:      General: She is not in acute distress.    Appearance: She is well-developed.  HENT:     Head: Normocephalic and atraumatic.     Right Ear: External ear normal.     Left Ear: External ear normal.     Nose: Nose normal.  Eyes:     Extraocular Movements: Extraocular movements intact.     Conjunctiva/sclera: Conjunctivae normal.     Pupils: Pupils are equal, round, and reactive to light.  Pulmonary:      Effort: Pulmonary effort is normal. No respiratory distress.  Musculoskeletal:     Cervical back: Normal range of motion and neck supple.     Right lower leg: Edema (1+) present.     Left lower leg: No edema.     Comments: DP pulses 2+ bilaterally.  Both feet appear warm well-perfused.  Full range of motion of the right leg.  No knee effusion noted.  No ankle effusion noted.  Skin:    General: Skin is warm and dry.  Neurological:     Mental Status: She is alert and oriented to person, place, and time. Mental status is at baseline.  Psychiatric:        Mood and Affect: Mood normal.     ED Results / Procedures / Treatments   Labs (all labs ordered are listed, but only abnormal results are displayed) Labs Reviewed  BASIC METABOLIC PANEL WITH GFR  CBC  PREGNANCY, URINE    EKG EKG Interpretation Date/Time:  Wednesday August 20 2023 19:29:31 EDT Ventricular Rate:  78 PR Interval:  134 QRS Duration:  81 QT Interval:  375 QTC Calculation: 428 R Axis:   66  Text Interpretation: Sinus rhythm Confirmed  by Shyrl Doyne [phone]) on 08/20/2023 8:58:22 PM  Radiology US  Venous Img Lower Unilateral Right Result Date: 08/20/2023 CLINICAL DATA:  Right lower extremity pain.  History of DVT. EXAM: RIGHT LOWER EXTREMITY VENOUS DOPPLER ULTRASOUND TECHNIQUE: Gray-scale sonography with compression, as well as color and duplex ultrasound, were performed to evaluate the deep venous system(s) from the level of the common femoral vein through the popliteal and proximal calf veins. COMPARISON:  None Available. FINDINGS: VENOUS Normal compressibility of the common femoral, superficial femoral, and popliteal veins, as well as the visualized calf veins. Visualized portions of profunda femoral vein and great saphenous vein unremarkable. No filling defects to suggest DVT on grayscale or color Doppler imaging. Doppler waveforms show normal direction of venous flow, normal respiratory plasticity and response to  augmentation. Limited views of the contralateral common femoral vein are unremarkable. OTHER None. Limitations: none IMPRESSION: No evidence of right lower extremity DVT. Electronically Signed   By: Chadwick Colonel M.D.   On: 08/20/2023 20:49    Procedures Procedures    Medications Ordered in ED Medications - No data to display  ED Course/ Medical Decision Making/ A&P                                 Medical Decision Making Amount and/or Complexity of Data Reviewed Labs: ordered.   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  Initial Ddx:  DVT, PE, venous insufficiency, lymphedema  MDM/Course:  Patient presents emergency department with atraumatic right leg pain.  Does have some mild amount of swelling on her right leg as well.  Does have a history of PE and not currently on anticoagulation.  Initially was concerned about possible DVT so obtained an ultrasound that did not show acute finding.  Suspect that the patient's symptoms are from venous insufficiency and her recent trip.  Will have her follow-up with her primary doctor in several days and use compression stockings for her pain.  This patient presents to the ED for concern of complaints listed in HPI, this involves an extensive number of treatment options, and is a complaint that carries with it a high risk of complications and morbidity. Disposition including potential need for admission considered.   Dispo: DC Home. Return precautions discussed including, but not limited to, those listed in the AVS. Allowed pt time to ask questions which were answered fully prior to dc.  Records reviewed Outpatient Clinic Notes The following labs were independently interpreted: Chemistry and show no acute abnormality I personally reviewed and interpreted the pt's EKG: see above for interpretation  I have reviewed the patients home medications and made adjustments as needed  Portions of  this note were generated with Dragon dictation software. Dictation errors may occur despite best attempts at proofreading.     Final Clinical Impression(s) / ED Diagnoses Final diagnoses:  Pain and swelling of right lower leg    Rx / DC Orders ED Discharge Orders     None         Ninetta Basket, MD 08/20/23 2230

## 2023-08-20 NOTE — ED Triage Notes (Addendum)
 Pt via POV c/o right leg pain from groin to ankle with swelling behind the knee x 5 days, worse when she presses her foot down. Prior hx PE and has been taking fertility meds with recent air travel. Pt has been taking eliquis during long distance travel but does not take it otherwise at home. Pt had some SOB and CP 2 weeks ago but none today. Leg pain rated 8/10 constant. Lab work drawn at dr today showed D. Dimer 0.19, negative per pt's MD who advised her to come to ER to rule out DVT.

## 2023-08-22 DIAGNOSIS — Z6832 Body mass index (BMI) 32.0-32.9, adult: Secondary | ICD-10-CM | POA: Diagnosis not present

## 2023-08-22 DIAGNOSIS — M7989 Other specified soft tissue disorders: Secondary | ICD-10-CM | POA: Diagnosis not present

## 2023-08-28 DIAGNOSIS — R7303 Prediabetes: Secondary | ICD-10-CM | POA: Diagnosis not present

## 2023-08-28 DIAGNOSIS — Z9189 Other specified personal risk factors, not elsewhere classified: Secondary | ICD-10-CM | POA: Diagnosis not present

## 2023-08-28 DIAGNOSIS — E66811 Obesity, class 1: Secondary | ICD-10-CM | POA: Diagnosis not present

## 2023-09-12 DIAGNOSIS — M79604 Pain in right leg: Secondary | ICD-10-CM | POA: Diagnosis not present

## 2023-09-15 DIAGNOSIS — Z6832 Body mass index (BMI) 32.0-32.9, adult: Secondary | ICD-10-CM | POA: Diagnosis not present

## 2023-09-15 DIAGNOSIS — M545 Low back pain, unspecified: Secondary | ICD-10-CM | POA: Diagnosis not present

## 2023-09-29 DIAGNOSIS — R102 Pelvic and perineal pain: Secondary | ICD-10-CM | POA: Diagnosis not present

## 2023-09-29 DIAGNOSIS — Z3202 Encounter for pregnancy test, result negative: Secondary | ICD-10-CM | POA: Diagnosis not present

## 2023-09-29 DIAGNOSIS — R7303 Prediabetes: Secondary | ICD-10-CM | POA: Diagnosis not present

## 2023-09-30 DIAGNOSIS — M5441 Lumbago with sciatica, right side: Secondary | ICD-10-CM | POA: Diagnosis not present

## 2023-09-30 DIAGNOSIS — G8929 Other chronic pain: Secondary | ICD-10-CM | POA: Diagnosis not present

## 2023-10-02 DIAGNOSIS — R102 Pelvic and perineal pain: Secondary | ICD-10-CM | POA: Diagnosis not present

## 2023-10-02 DIAGNOSIS — D252 Subserosal leiomyoma of uterus: Secondary | ICD-10-CM | POA: Diagnosis not present

## 2023-10-09 DIAGNOSIS — M6281 Muscle weakness (generalized): Secondary | ICD-10-CM | POA: Diagnosis not present

## 2023-10-09 DIAGNOSIS — M5459 Other low back pain: Secondary | ICD-10-CM | POA: Diagnosis not present

## 2023-10-15 DIAGNOSIS — M6281 Muscle weakness (generalized): Secondary | ICD-10-CM | POA: Diagnosis not present

## 2023-10-15 DIAGNOSIS — M5459 Other low back pain: Secondary | ICD-10-CM | POA: Diagnosis not present

## 2023-10-17 DIAGNOSIS — M5459 Other low back pain: Secondary | ICD-10-CM | POA: Diagnosis not present

## 2023-10-17 DIAGNOSIS — M6281 Muscle weakness (generalized): Secondary | ICD-10-CM | POA: Diagnosis not present

## 2023-10-22 DIAGNOSIS — M6281 Muscle weakness (generalized): Secondary | ICD-10-CM | POA: Diagnosis not present

## 2023-10-22 DIAGNOSIS — M5459 Other low back pain: Secondary | ICD-10-CM | POA: Diagnosis not present

## 2023-10-24 DIAGNOSIS — M5459 Other low back pain: Secondary | ICD-10-CM | POA: Diagnosis not present

## 2023-10-24 DIAGNOSIS — M6281 Muscle weakness (generalized): Secondary | ICD-10-CM | POA: Diagnosis not present

## 2023-10-28 DIAGNOSIS — M5441 Lumbago with sciatica, right side: Secondary | ICD-10-CM | POA: Diagnosis not present

## 2023-10-29 DIAGNOSIS — Z79899 Other long term (current) drug therapy: Secondary | ICD-10-CM | POA: Diagnosis not present

## 2023-10-29 DIAGNOSIS — E88819 Insulin resistance, unspecified: Secondary | ICD-10-CM | POA: Diagnosis not present

## 2023-10-29 DIAGNOSIS — Z6831 Body mass index (BMI) 31.0-31.9, adult: Secondary | ICD-10-CM | POA: Diagnosis not present

## 2023-10-29 DIAGNOSIS — M5459 Other low back pain: Secondary | ICD-10-CM | POA: Diagnosis not present

## 2023-10-29 DIAGNOSIS — M6281 Muscle weakness (generalized): Secondary | ICD-10-CM | POA: Diagnosis not present

## 2023-10-29 DIAGNOSIS — E282 Polycystic ovarian syndrome: Secondary | ICD-10-CM | POA: Diagnosis not present

## 2023-10-29 DIAGNOSIS — R5382 Chronic fatigue, unspecified: Secondary | ICD-10-CM | POA: Diagnosis not present

## 2023-10-29 DIAGNOSIS — R7303 Prediabetes: Secondary | ICD-10-CM | POA: Diagnosis not present

## 2023-11-04 DIAGNOSIS — M5459 Other low back pain: Secondary | ICD-10-CM | POA: Diagnosis not present

## 2023-11-04 DIAGNOSIS — M6281 Muscle weakness (generalized): Secondary | ICD-10-CM | POA: Diagnosis not present

## 2023-11-14 DIAGNOSIS — M5459 Other low back pain: Secondary | ICD-10-CM | POA: Diagnosis not present

## 2023-11-14 DIAGNOSIS — M6281 Muscle weakness (generalized): Secondary | ICD-10-CM | POA: Diagnosis not present

## 2023-11-19 DIAGNOSIS — M5459 Other low back pain: Secondary | ICD-10-CM | POA: Diagnosis not present

## 2023-11-19 DIAGNOSIS — M6281 Muscle weakness (generalized): Secondary | ICD-10-CM | POA: Diagnosis not present

## 2023-11-26 DIAGNOSIS — D259 Leiomyoma of uterus, unspecified: Secondary | ICD-10-CM | POA: Diagnosis not present

## 2023-11-28 DIAGNOSIS — M5459 Other low back pain: Secondary | ICD-10-CM | POA: Diagnosis not present

## 2023-11-28 DIAGNOSIS — M6281 Muscle weakness (generalized): Secondary | ICD-10-CM | POA: Diagnosis not present

## 2023-12-08 DIAGNOSIS — R5382 Chronic fatigue, unspecified: Secondary | ICD-10-CM | POA: Diagnosis not present

## 2023-12-08 DIAGNOSIS — E282 Polycystic ovarian syndrome: Secondary | ICD-10-CM | POA: Diagnosis not present

## 2023-12-08 DIAGNOSIS — E88819 Insulin resistance, unspecified: Secondary | ICD-10-CM | POA: Diagnosis not present

## 2023-12-08 DIAGNOSIS — Z79899 Other long term (current) drug therapy: Secondary | ICD-10-CM | POA: Diagnosis not present

## 2023-12-08 DIAGNOSIS — R7303 Prediabetes: Secondary | ICD-10-CM | POA: Diagnosis not present

## 2023-12-09 DIAGNOSIS — M6281 Muscle weakness (generalized): Secondary | ICD-10-CM | POA: Diagnosis not present

## 2023-12-09 DIAGNOSIS — M5459 Other low back pain: Secondary | ICD-10-CM | POA: Diagnosis not present

## 2023-12-16 DIAGNOSIS — Z01419 Encounter for gynecological examination (general) (routine) without abnormal findings: Secondary | ICD-10-CM | POA: Diagnosis not present

## 2023-12-16 DIAGNOSIS — Z6833 Body mass index (BMI) 33.0-33.9, adult: Secondary | ICD-10-CM | POA: Diagnosis not present

## 2023-12-16 DIAGNOSIS — Z124 Encounter for screening for malignant neoplasm of cervix: Secondary | ICD-10-CM | POA: Diagnosis not present

## 2023-12-16 DIAGNOSIS — Z1151 Encounter for screening for human papillomavirus (HPV): Secondary | ICD-10-CM | POA: Diagnosis not present

## 2023-12-24 DIAGNOSIS — Z6831 Body mass index (BMI) 31.0-31.9, adult: Secondary | ICD-10-CM | POA: Diagnosis not present

## 2023-12-24 DIAGNOSIS — Z79899 Other long term (current) drug therapy: Secondary | ICD-10-CM | POA: Diagnosis not present

## 2023-12-24 DIAGNOSIS — R7303 Prediabetes: Secondary | ICD-10-CM | POA: Diagnosis not present

## 2023-12-24 DIAGNOSIS — E66811 Obesity, class 1: Secondary | ICD-10-CM | POA: Diagnosis not present

## 2023-12-24 DIAGNOSIS — R5382 Chronic fatigue, unspecified: Secondary | ICD-10-CM | POA: Diagnosis not present

## 2023-12-30 DIAGNOSIS — N760 Acute vaginitis: Secondary | ICD-10-CM | POA: Diagnosis not present

## 2023-12-30 DIAGNOSIS — R8290 Unspecified abnormal findings in urine: Secondary | ICD-10-CM | POA: Diagnosis not present

## 2024-01-14 DIAGNOSIS — R7303 Prediabetes: Secondary | ICD-10-CM | POA: Diagnosis not present

## 2024-01-14 DIAGNOSIS — Z79899 Other long term (current) drug therapy: Secondary | ICD-10-CM | POA: Diagnosis not present

## 2024-02-05 DIAGNOSIS — Z6832 Body mass index (BMI) 32.0-32.9, adult: Secondary | ICD-10-CM | POA: Diagnosis not present

## 2024-02-05 DIAGNOSIS — Z0000 Encounter for general adult medical examination without abnormal findings: Secondary | ICD-10-CM | POA: Diagnosis not present

## 2024-02-17 ENCOUNTER — Encounter: Payer: Self-pay | Admitting: Physician Assistant

## 2024-02-17 ENCOUNTER — Ambulatory Visit: Admitting: Physician Assistant

## 2024-02-17 VITALS — BP 107/62 | HR 77

## 2024-02-17 DIAGNOSIS — D229 Melanocytic nevi, unspecified: Secondary | ICD-10-CM

## 2024-02-17 DIAGNOSIS — D489 Neoplasm of uncertain behavior, unspecified: Secondary | ICD-10-CM | POA: Diagnosis not present

## 2024-02-17 DIAGNOSIS — L814 Other melanin hyperpigmentation: Secondary | ICD-10-CM | POA: Diagnosis not present

## 2024-02-17 DIAGNOSIS — W908XXA Exposure to other nonionizing radiation, initial encounter: Secondary | ICD-10-CM | POA: Diagnosis not present

## 2024-02-17 DIAGNOSIS — D2362 Other benign neoplasm of skin of left upper limb, including shoulder: Secondary | ICD-10-CM

## 2024-02-17 DIAGNOSIS — Z1283 Encounter for screening for malignant neoplasm of skin: Secondary | ICD-10-CM | POA: Diagnosis not present

## 2024-02-17 DIAGNOSIS — D1801 Hemangioma of skin and subcutaneous tissue: Secondary | ICD-10-CM

## 2024-02-17 DIAGNOSIS — L578 Other skin changes due to chronic exposure to nonionizing radiation: Secondary | ICD-10-CM | POA: Diagnosis not present

## 2024-02-17 DIAGNOSIS — L80 Vitiligo: Secondary | ICD-10-CM

## 2024-02-17 DIAGNOSIS — D239 Other benign neoplasm of skin, unspecified: Secondary | ICD-10-CM

## 2024-02-17 DIAGNOSIS — L821 Other seborrheic keratosis: Secondary | ICD-10-CM

## 2024-02-17 MED ORDER — TRIAMCINOLONE ACETONIDE 0.1 % EX OINT: 1.0000 | TOPICAL_OINTMENT | Freq: Two times a day (BID) | CUTANEOUS | 1 refills | Status: AC

## 2024-02-17 NOTE — Progress Notes (Signed)
 Total Body Skin Exam (TBSE) Visit   Subjective  Pamela Oconnor is a 38 y.o. female ESTABLISHED PATIENT who presents for the following: Skin Cancer Screening and Full Body Skin Exam  Patient presents today for follow up visit for TBSE. Patient was last evaluated on 03/05/23 . Patient denies medication changes. Patient reports she does not have spots, moles and lesions of concern to be evaluated. Patient reports throughout her lifetime she has had moderate sun exposure. Currently, patient reports if she has excessive sun exposure, she does apply sunscreen and/or wears protective coverings. Patient reports she has hx of bx. Patient denies  family history of skin cancers. The patient has spots, moles and lesions to be evaluated, some may be new or changing and the patient has concerns that these could be cancer.  The following portions of the chart were reviewed this encounter and updated as appropriate: medications, allergies, medical history  Review of Systems:  No other skin or systemic complaints except as noted in HPI or Assessment and Plan.  Objective  Well appearing patient in no apparent distress; mood and affect are within normal limits.  A full examination was performed including scalp, head, eyes, ears, nose, lips, neck, chest, axillae, abdomen, back, buttocks, bilateral upper extremities, bilateral lower extremities, hands, feet, fingers, toes, fingernails, and toenails. All findings within normal limits unless otherwise noted below.   Relevant physical exam findings are noted in the Assessment and Plan.  Left Thigh - Posterior 0.6 irregular brown macule   Assessment & Plan   LENTIGINES, SEBORRHEIC KERATOSES, HEMANGIOMAS - Benign normal skin lesions - Benign-appearing - Call for any changes  MELANOCYTIC NEVI - Tan-brown and/or pink-flesh-colored symmetric macules and papules - Benign appearing on exam today - Observation - Call clinic for new or changing moles - Recommend  daily use of broad spectrum spf 30+ sunscreen to sun-exposed areas.   ACTINIC DAMAGE - Chronic condition, secondary to cumulative UV/sun exposure - diffuse scaly erythematous macules with underlying dyspigmentation - Recommend daily broad spectrum sunscreen SPF 30+ to sun-exposed areas, reapply every 2 hours as needed.  - Staying in the shade or wearing long sleeves, sun glasses (UVA+UVB protection) and wide brim hats (4-inch brim around the entire circumference of the hat) are also recommended for sun protection.  - Call for new or changing lesions.  SKIN CANCER SCREENING PERFORMED TODAY.  DERMATOFIBROMA-LEFT ARM  Exam: Firm pink/brown papulenodule with dimple sign.  Treatment Plan: A dermatofibroma is a benign growth possibly related to trauma, such as an insect bite, cut from shaving, or inflamed acne-type bump.  Treatment options to remove include shave or excision with resulting scar and risk of recurrence.  Since benign-appearing and not bothersome, will observe for now.    VITILIGO Exam: depigmented patches on inner left thigh  Vitiligo is a chronic autoimmune condition which causes loss of skin pigment and is commonly seen on the face and may also involve areas of trauma like hands, elbows, knees, and ankles. There is no cure and it is difficult to treat.  Treatments include topical steroids and other topical anti-inflammatory ointments/creams and topical and oral Jak inhibitors.  Sometimes narrow band UV light therapy or Xtrac laser is helpful, both of which require twice weekly treatments for at least 3-6 months.  Antioxidant vitamins, such as Vitamins A,C,E,D, Folic Acid and B12 may be added to enhance treatment. Heliocare may also enhance treatment results.  Treatment Plan: TMC ointment 0.1%   VITILIGO   Related Medications triamcinolone  ointment (KENALOG )  0.1 % Apply 1 Application topically 2 (two) times daily. To affected area NEOPLASM OF UNCERTAIN BEHAVIOR Left Thigh -  Posterior Epidermal / dermal shaving  Lesion diameter (cm):  0.6 Informed consent: discussed and consent obtained   Timeout: patient name, date of birth, surgical site, and procedure verified   Procedure prep:  Patient was prepped and draped in usual sterile fashion Prep type:  Isopropyl alcohol Anesthesia: the lesion was anesthetized in a standard fashion   Anesthetic:  1% lidocaine w/ epinephrine 1-100,000 buffered w/ 8.4% NaHCO3 Instrument used: flexible razor blade   Hemostasis achieved with: pressure, aluminum chloride and electrodesiccation   Outcome: patient tolerated procedure well   Post-procedure details: sterile dressing applied and wound care instructions given   Dressing type: bandage and petrolatum    Specimen 1 - Surgical pathology Differential Diagnosis: 0.6 cm  DN vs MM   Check Margins: No LENTIGINES   ACTINIC SKIN DAMAGE   CHERRY ANGIOMA   MULTIPLE BENIGN NEVI   DERMATOFIBROMA   SEBORRHEIC KERATOSIS   SCREENING EXAM FOR SKIN CANCER   Return in about 1 year (around 02/16/2025) for TBSE follow up.  I, Doyce Pan, CMA, am acting as scribe for Ily Denno K, PA-C.   Documentation: I have reviewed the above documentation for accuracy and completeness, and I agree with the above.  Teresa Nicodemus K, PA-C

## 2024-02-17 NOTE — Patient Instructions (Signed)

## 2024-02-18 DIAGNOSIS — R7303 Prediabetes: Secondary | ICD-10-CM | POA: Diagnosis not present

## 2024-02-18 DIAGNOSIS — R5382 Chronic fatigue, unspecified: Secondary | ICD-10-CM | POA: Diagnosis not present

## 2024-02-18 DIAGNOSIS — Z79899 Other long term (current) drug therapy: Secondary | ICD-10-CM | POA: Diagnosis not present

## 2024-02-19 DIAGNOSIS — R350 Frequency of micturition: Secondary | ICD-10-CM | POA: Diagnosis not present

## 2024-02-19 DIAGNOSIS — N390 Urinary tract infection, site not specified: Secondary | ICD-10-CM | POA: Diagnosis not present

## 2024-02-19 DIAGNOSIS — R300 Dysuria: Secondary | ICD-10-CM | POA: Diagnosis not present

## 2024-02-19 LAB — SURGICAL PATHOLOGY

## 2024-02-22 ENCOUNTER — Ambulatory Visit: Payer: Self-pay | Admitting: Physician Assistant

## 2024-02-26 DIAGNOSIS — M25572 Pain in left ankle and joints of left foot: Secondary | ICD-10-CM | POA: Diagnosis not present

## 2025-02-23 ENCOUNTER — Ambulatory Visit: Admitting: Physician Assistant
